# Patient Record
Sex: Male | Born: 1955
Health system: Southern US, Community
[De-identification: ages and names within clinical notes are randomized; demographics above are authoritative.]

## PROBLEM LIST (undated history)

## (undated) DIAGNOSIS — E785 Hyperlipidemia, unspecified: Secondary | ICD-10-CM

## (undated) DIAGNOSIS — J301 Allergic rhinitis due to pollen: Secondary | ICD-10-CM

## (undated) DIAGNOSIS — N042 Nephrotic syndrome with diffuse membranous glomerulonephritis, unspecified: Secondary | ICD-10-CM

## (undated) DIAGNOSIS — Z8619 Personal history of other infectious and parasitic diseases: Secondary | ICD-10-CM

## (undated) DIAGNOSIS — E119 Type 2 diabetes mellitus without complications: Secondary | ICD-10-CM

## (undated) DIAGNOSIS — T7840XA Allergy, unspecified, initial encounter: Secondary | ICD-10-CM

## (undated) DIAGNOSIS — K219 Gastro-esophageal reflux disease without esophagitis: Secondary | ICD-10-CM

## (undated) HISTORY — DX: Gastro-esophageal reflux disease without esophagitis: K21.9

## (undated) HISTORY — DX: Nephrotic syndrome with diffuse membranous glomerulonephritis, unspecified: N04.20

## (undated) HISTORY — DX: Type 2 diabetes mellitus without complications: E11.9

## (undated) HISTORY — PX: CIRCUMCISION: SUR203

## (undated) HISTORY — DX: Hyperlipidemia, unspecified: E78.5

## (undated) HISTORY — DX: Allergic rhinitis due to pollen: J30.1

## (undated) HISTORY — DX: Allergy, unspecified, initial encounter: T78.40XA

## (undated) HISTORY — DX: Personal history of other infectious and parasitic diseases: Z86.19

---

## 2000-11-19 ENCOUNTER — Emergency Department (HOSPITAL_COMMUNITY): Admission: EM | Admit: 2000-11-19 | Discharge: 2000-11-20 | Payer: Self-pay | Admitting: Emergency Medicine

## 2000-11-19 ENCOUNTER — Encounter: Payer: Self-pay | Admitting: Emergency Medicine

## 2001-11-03 ENCOUNTER — Encounter: Admission: RE | Admit: 2001-11-03 | Discharge: 2002-02-01 | Payer: Self-pay | Admitting: Internal Medicine

## 2003-10-18 ENCOUNTER — Emergency Department (HOSPITAL_COMMUNITY): Admission: EM | Admit: 2003-10-18 | Discharge: 2003-10-18 | Payer: Self-pay | Admitting: Emergency Medicine

## 2004-01-01 ENCOUNTER — Emergency Department (HOSPITAL_COMMUNITY): Admission: EM | Admit: 2004-01-01 | Discharge: 2004-01-01 | Payer: Self-pay | Admitting: Emergency Medicine

## 2004-01-02 ENCOUNTER — Emergency Department (HOSPITAL_COMMUNITY): Admission: EM | Admit: 2004-01-02 | Discharge: 2004-01-03 | Payer: Self-pay | Admitting: Emergency Medicine

## 2008-09-07 ENCOUNTER — Telehealth (INDEPENDENT_AMBULATORY_CARE_PROVIDER_SITE_OTHER): Payer: Self-pay | Admitting: *Deleted

## 2008-10-18 ENCOUNTER — Ambulatory Visit: Payer: Self-pay | Admitting: Internal Medicine

## 2008-10-18 LAB — CONVERTED CEMR LAB
ALT: 27 units/L (ref 0–53)
AST: 27 units/L (ref 0–37)
Albumin: 3.7 g/dL (ref 3.5–5.2)
Alkaline Phosphatase: 62 units/L (ref 39–117)
BUN: 12 mg/dL (ref 6–23)
Basophils Absolute: 0.2 10*3/uL — ABNORMAL HIGH (ref 0.0–0.1)
Basophils Relative: 2.4 % (ref 0.0–3.0)
Bilirubin Urine: NEGATIVE
Bilirubin, Direct: 0 mg/dL (ref 0.0–0.3)
CO2: 30 meq/L (ref 19–32)
Calcium: 8.9 mg/dL (ref 8.4–10.5)
Chloride: 106 meq/L (ref 96–112)
Cholesterol: 163 mg/dL (ref 0–200)
Creatinine, Ser: 0.8 mg/dL (ref 0.4–1.5)
Direct LDL: 93.6 mg/dL
Eosinophils Absolute: 0.4 10*3/uL (ref 0.0–0.7)
Eosinophils Relative: 4.9 % (ref 0.0–5.0)
GFR calc non Af Amer: 130.78 mL/min (ref >60)
Glucose, Bld: 135 mg/dL — ABNORMAL HIGH (ref 70–99)
HCT: 40.4 % (ref 39.0–52.0)
HDL: 47.6 mg/dL (ref >39.00)
Hemoglobin, Urine: NEGATIVE
Hemoglobin: 13.8 g/dL (ref 13.0–17.0)
Ketones, ur: NEGATIVE mg/dL
Leukocytes, UA: NEGATIVE
Lymphocytes Relative: 41 % (ref 12.0–46.0)
Lymphs Abs: 3.2 10*3/uL (ref 0.7–4.0)
MCHC: 34.3 g/dL (ref 30.0–36.0)
MCV: 96.2 fL (ref 78.0–100.0)
Monocytes Absolute: 0.5 10*3/uL (ref 0.1–1.0)
Monocytes Relative: 6 % (ref 3.0–12.0)
Neutro Abs: 3.5 10*3/uL (ref 1.4–7.7)
Neutrophils Relative %: 45.7 % (ref 43.0–77.0)
Nitrite: NEGATIVE
PSA: 0.46 ng/mL (ref 0.10–4.00)
Platelets: 344 10*3/uL (ref 150.0–400.0)
Potassium: 4.1 meq/L (ref 3.5–5.1)
RBC: 4.2 M/uL — ABNORMAL LOW (ref 4.22–5.81)
RDW: 12.5 % (ref 11.5–14.6)
Sodium: 141 meq/L (ref 135–145)
Specific Gravity, Urine: 1.02 (ref 1.000–1.030)
TSH: 0.9 microintl units/mL (ref 0.35–5.50)
Total Bilirubin: 0.7 mg/dL (ref 0.3–1.2)
Total CHOL/HDL Ratio: 3
Total Protein, Urine: NEGATIVE mg/dL
Total Protein: 6.7 g/dL (ref 6.0–8.3)
Triglycerides: 228 mg/dL — ABNORMAL HIGH (ref 0.0–149.0)
Urine Glucose: NEGATIVE mg/dL
Urobilinogen, UA: 0.2 (ref 0.0–1.0)
VLDL: 45.6 mg/dL — ABNORMAL HIGH (ref 0.0–40.0)
WBC: 7.8 10*3/uL (ref 4.5–10.5)
pH: 6 (ref 5.0–8.0)

## 2008-10-24 ENCOUNTER — Ambulatory Visit: Payer: Self-pay | Admitting: Internal Medicine

## 2008-10-24 LAB — CONVERTED CEMR LAB: Hgb A1c MFr Bld: 6.8 % — ABNORMAL HIGH (ref 4.6–6.5)

## 2008-10-25 DIAGNOSIS — A64 Unspecified sexually transmitted disease: Secondary | ICD-10-CM | POA: Insufficient documentation

## 2008-10-25 DIAGNOSIS — J309 Allergic rhinitis, unspecified: Secondary | ICD-10-CM | POA: Insufficient documentation

## 2009-08-14 ENCOUNTER — Encounter (INDEPENDENT_AMBULATORY_CARE_PROVIDER_SITE_OTHER): Payer: Self-pay | Admitting: *Deleted

## 2009-10-19 ENCOUNTER — Ambulatory Visit: Payer: Self-pay | Admitting: Internal Medicine

## 2009-10-19 DIAGNOSIS — B029 Zoster without complications: Secondary | ICD-10-CM | POA: Insufficient documentation

## 2010-01-22 ENCOUNTER — Ambulatory Visit: Payer: Self-pay | Admitting: Internal Medicine

## 2010-01-22 DIAGNOSIS — F17200 Nicotine dependence, unspecified, uncomplicated: Secondary | ICD-10-CM | POA: Insufficient documentation

## 2010-01-22 LAB — CONVERTED CEMR LAB
BUN: 8 mg/dL (ref 6–23)
Basophils Absolute: 0 10*3/uL (ref 0.0–0.1)
Basophils Relative: 0.5 % (ref 0.0–3.0)
CO2: 27 meq/L (ref 19–32)
Calcium: 9.1 mg/dL (ref 8.4–10.5)
Chloride: 108 meq/L (ref 96–112)
Cholesterol: 182 mg/dL (ref 0–200)
Creatinine, Ser: 0.9 mg/dL (ref 0.4–1.5)
Eosinophils Absolute: 0.3 10*3/uL (ref 0.0–0.7)
Eosinophils Relative: 3.8 % (ref 0.0–5.0)
GFR calc non Af Amer: 112.16 mL/min (ref >60)
Glucose, Bld: 101 mg/dL — ABNORMAL HIGH (ref 70–99)
HCT: 39.3 % (ref 39.0–52.0)
HDL: 48.1 mg/dL (ref >39.00)
Hemoglobin: 13.6 g/dL (ref 13.0–17.0)
LDL Cholesterol: 116 mg/dL — ABNORMAL HIGH (ref 0–99)
Lymphocytes Relative: 26.3 % (ref 12.0–46.0)
Lymphs Abs: 2.2 10*3/uL (ref 0.7–4.0)
MCHC: 34.6 g/dL (ref 30.0–36.0)
MCV: 97.4 fL (ref 78.0–100.0)
Monocytes Absolute: 0.4 10*3/uL (ref 0.1–1.0)
Monocytes Relative: 5.2 % (ref 3.0–12.0)
Neutro Abs: 5.4 10*3/uL (ref 1.4–7.7)
Neutrophils Relative %: 64.2 % (ref 43.0–77.0)
Platelets: 314 10*3/uL (ref 150.0–400.0)
Potassium: 4.4 meq/L (ref 3.5–5.1)
RBC: 4.03 M/uL — ABNORMAL LOW (ref 4.22–5.81)
RDW: 13.2 % (ref 11.5–14.6)
Sodium: 142 meq/L (ref 135–145)
Total CHOL/HDL Ratio: 4
Triglycerides: 91 mg/dL (ref 0.0–149.0)
VLDL: 18.2 mg/dL (ref 0.0–40.0)
WBC: 8.4 10*3/uL (ref 4.5–10.5)

## 2010-09-03 NOTE — Assessment & Plan Note (Signed)
Summary: cpx / bcbs / #/ cd   Vital Signs:  Patient profile:   55 year old male Height:      68 inches Weight:      170 pounds BMI:     25.94 O2 Sat:      97 % on Room air Temp:     98.0 degrees F oral Pulse rate:   56 / minute BP sitting:   120 / 72  (left arm) Cuff size:   regular  Vitals Entered By: Bill Salinas CMA (January 22, 2010 2:16 PM)  O2 Flow:  Room air CC: cpx/ ab   Primary Care Provider:  Jacques Navy MD  CC:  cpx/ ab.  History of Present Illness: Patient presents for general medical follow-up. He reports that he continues to have skin eruptions on the scalp. Hecontinues to have back spasm on the right buttock area with radiation to the leg. He plays tennis but doesn't do regular back exercises.  He reports an occasional muscle tightness across the anterior chest, non-exertional. Otherwise doing OK.  Current Medications (verified): 1)  None  Allergies (verified): No Known Drug Allergies  Past History:  Past Medical History: Last updated: 2008-11-09 UCD AODM (ICD-250.00) HAY FEVER (ICD-477.0) Hx of SEXUALLY TRANSMITTED DISEASE (ICD-099.9)  Past Surgical History: Last updated: 09-Nov-2008 Circumcision '91 (balanitis)  Family History: Last updated: 11/09/08 father - deceased @ 8: cancer mother - deceased @ 2: GSW Neg - colon or prostate cancer Pos - diabetes, arthritis  Social History: Last updated: 11/09/08 HSG Married '94 - divorced '97 no children work: Mining engineer parts - sales/delivery; Geophysicist/field seismologist; 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    Physical Exam  General:   WNWD AA male in no distress Head:  normocephalic and atraumatic.   Eyes:  No corneal or conjunctival inflammation noted. EOMI. Perrla. Funduscopic exam benign, without hemorrhages, exudates or papilledema. Vision grossly normal. Ears:  R ear normal and L ear normal.   Nose:  no external deformity and no external erythema.   Mouth:  Oral mucosa and oropharynx without lesions or exudates.  Teeth in good repair. Neck:  supple, full ROM, no thyromegaly, and no carotid bruits.   Chest Wall:  No deformities, masses, tenderness or gynecomastia noted. Lungs:  Normal respiratory effort, chest expands symmetrically. Lungs are clear to auscultation, no crackles or wheezes. Heart:  Normal rate and regular rhythm. S1 and S2 normal without gallop, murmur, click, rub or other extra sounds. Abdomen:  Bowel sounds positive,abdomen soft and non-tender without masses, organomegaly or hernias noted. Rectal:  No external abnormalities noted. Normal sphincter tone. No rectal masses or tenderness. Prostate:  Prostate gland firm and smooth, no enlargement, nodularity, tenderness, mass, asymmetry or induration. Msk:  normal ROM, no joint tenderness, no joint swelling, no joint warmth, and no joint deformities.   Pulses:  2+ radial and DP pulses Extremities:  No clubbing, cyanosis, edema, or deformity noted with normal full range of motion of all joints.   Neurologic:  alert & oriented X3, cranial nerves II-XII intact, strength normal in all extremities, sensation intact to light touch, gait normal, and DTRs symmetrical and normal.   Skin:  turgor normal, color normal, no rashes, and no ulcerations.  Cervical Nodes:  no anterior cervical adenopathy and no posterior cervical adenopathy.   Inguinal Nodes:  no R inguinal adenopathy and no L inguinal adenopathy.   Psych:  Oriented X3, memory intact for recent and remote, normally interactive, and good eye contact.     Impression & Recommendations:  Problem # 1:  TOBACCO  ABUSE (ICD-305.1) Smoke to at length about the dangers and hazards of smoking; the addictive nature of smoking.   Plan - encouraged to stop smoking            1-800-quit now           CXR  Orders: T-2 View CXR (71020TC) Tobacco use cessation intermediate 3-10 minutes (13086)  Addendum - negative study  Problem # 2:  AODM (ICD-250.00) No symptoms  Plan- routine lab  Addendum - glucose 101  Problem # 3:  Preventive Health Care (ICD-V70.0) Patient with normal history and physical exam. Labs are OK. Patient is active and health concious except for the aberration of smoking. He is encouraged to have colonoscopy - he will think this over. The importance of screening was stressed to him in terms of cancer prevention. He is given a tetnus booster today.  In summary - a nice man who is medically stable. He will return as needed.  Other Orders: TLB-Lipid Panel (80061-LIPID) TLB-BMP (Basic Metabolic Panel-BMET) (80048-METABOL) TLB-CBC Platelet - w/Differential (85025-CBCD) Tdap => 43yrs IM (57846) Admin 1st Vaccine (96295) tientIvar Drape Panuco Note: All result statuses are Final unless otherwise noted.  Tests: (1) Lipid Panel (LIPID)   Cholesterol               182 mg/dL                   2-841     ATP III Classification            Desirable:  < 200 mg/dL                    Borderline High:  200 - 239 mg/dL               High:  > = 240 mg/dL   Triglycerides             91.0 mg/dL                  3.2-440.1     Normal:  <150 mg/dL     Borderline High:  027 - 199 mg/dL   HDL                       25.36 mg/dL                 >64.40   VLDL Cholesterol          18.2 mg/dL                  3.4-74.2   LDL Cholesterol      [H]  595 mg/dL                   6-38  CHO/HDL Ratio:  CHD Risk                             4                    Men  Women     1/2 Average Risk     3.4          3.3     Average Risk          5.0          4.4     2X Average Risk          9.6          7.1      3X Average Risk          15.0          11.0                           Tests: (2) BMP (METABOL)   Sodium                    142 mEq/L                   135-145   Potassium                 4.4 mEq/L                   3.5-5.1   Chloride                  108 mEq/L                   96-112   Carbon Dioxide            27 mEq/L                    19-32   Glucose              [H]  101 mg/dL                   04-54   BUN                       8 mg/dL                     0-98   Creatinine                0.9 mg/dL                   1.1-9.1   Calcium                   9.1 mg/dL                   4.7-82.9   GFR                       112.16 mL/min               >60  Tests: (3) CBC Platelet w/Diff (CBCD)   White Cell Count          8.4 K/uL                    4.5-10.5   Red Cell Count       [L]  4.03 Mil/uL                 4.22-5.81   Hemoglobin                13.6 g/dL  13.0-17.0   Hematocrit                39.3 %                      39.0-52.0   MCV                       97.4 fl                     78.0-100.0   MCHC                      34.6 g/dL                   16.1-09.6   RDW                       13.2 %                      11.5-14.6   Platelet Count            314.0 K/uL                  150.0-400.0   Neutrophil %              64.2 %                      43.0-77.0   Lymphocyte %              26.3 %                      12.0-46.0   Monocyte %                5.2 %                       3.0-12.0   Eosinophils%              3.8 %                       0.0-5.0   Basophils %               0.5 %                       0.0-3.0   Neutrophill Absolute      5.4 K/uL                    1.4-7.7   Lymphocyte Absolute       2.2 K/uL                    0.7-4.0   Monocyte Absolute         0.4 K/uL                    0.1-1.0  Eosinophils, Absolute                             0.3 K/uL                    0.0-0.7   Basophils Absolute        0.0 K/uL  0.0-0.1  DG CHEST 2 VIEW  - [id number]   Clinical Data: Tobacco use   CHEST - 2 VIEW   Comparison: None.   Findings: Normal heart size.  Clear lungs.  No pneumothorax.  No pleural fluid.   IMPRESSION: No active cardiopulmonary disease.   Read By:  Jolaine Click,  M.D.  Immunization History:  Influenza Immunization History:    Influenza:  historical (01/22/2010)  Immunizations Administered:  Tetanus Vaccine:    Vaccine Type: Tdap    Site: left deltoid    Mfr: GlaxoSmithKline    Dose: 0.5 ml    Given by: Ami Bullins CMA    Exp. Date: 10/27/2011    Lot #: [id number]    VIS given: 06/22/07 version given January 22, 2010.

## 2010-09-03 NOTE — Miscellaneous (Signed)
Summary: [facility]   Imported By: Lester Wilsall 10/26/2009 10:19:09  _____________________________________________________________________  External Attachment:    Type:   Image     Comment:   External Document

## 2010-09-03 NOTE — Assessment & Plan Note (Signed)
Summary: TORSO RASH---STC   Vital Signs:  Patient profile:   55 year old male Height:      68 inches Weight:      170 pounds BMI:     25.94 O2 Sat:      98 % on Room air Temp:     97.2 degrees F oral Pulse rate:   56 / minute BP sitting:   110 / 78  (left arm) Cuff size:   large  Vitals Entered By: Bill Salinas CMA (October 19, 2009 10:05 AM)  O2 Flow:  Room air CC: pt here with complaint of itchy rash on his abd and back x 4 days/ ab  Vision Screening:      Vision Comments: last eye exam 1999   Primary Care Provider:  Jacques Navy MD  CC:  pt here with complaint of itchy rash on his abd and back x 4 days/ ab.  History of Present Illness: Having seasonal allergies with runny nose and itchy eyes.   Has a rash: scalp, chest and back. The rash on the back and chest is just on the left side and it is a little uncomfortable.   Current Medications (verified): 1)  None  Allergies (verified): No Known Drug Allergies  Past History:  Past Medical History: Last updated: 11/16/08 UCD AODM (ICD-250.00) HAY FEVER (ICD-477.0) Hx of SEXUALLY TRANSMITTED DISEASE (ICD-099.9)  Past Surgical History: Last updated: November 16, 2008 Circumcision '91 (balanitis)  Family History: Last updated: 11/16/08 father - deceased @ 37: cancer mother - deceased @ 77: GSW Neg - colon or prostate cancer Pos - diabetes, arthritis  Social History: Last updated: 2008/11/16 HSG Married '94 - divorced '97 no children work: Mining engineer parts - sales/delivery; Geophysicist/field seismologist; 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    Physical Exam  General:  WNWD AA male in no distress Skin:  erythematous macular raised rash at the mid-line of the back at t 11 and on  the anterior chest under the left  breast at t8-9 and towards the left of the umbilicus.  Scapl without distinct active rash.    Impression & Recommendations:  Problem # 1:  SHINGLES (ICD-053.9) Rash that is suspicious for early shingels.  Plan - V altrex 100mg  three times a day x 7            Prednisone 20mg  once daily x 7  Complete Medication List: 1)  Valtrex 1 Gm Tabs (Valacyclovir hcl) .Marland Kitchen.. 1 by mouth three times a day x 7 for shingles 2)  Prednisone 20 Mg Tabs (Prednisone) .Marland Kitchen.. 1 by mouth once daily x 7  Patient Instructions: 1)  Rash on back and left chest suspicious for early shingles. Plan - valtrex 1g taken three times a day for 7 days along with prednisone 20mg  taken daily for 7 days. 2)  seasonal allergies - try taking loratadine 10mg  once a day - over the counter 3)  scalp - need to return when the rash is present to better diagnose and treat. Prescriptions: PREDNISONE 20 MG TABS (PREDNISONE) 1 by mouth once daily x 7  #7 x 0   Entered and Authorized by:   Jacques Navy MD   Signed by:   Jacques Navy MD on 10/19/2009   Method used:   Electronically to  [facility]  [address] [phone]* (retail)       [address]       [place], [place]  [address]       Ph: [phone] or [phone]       Fax: [phone]   RxID:   [id number] VALTREX 1 GM TABS (VALACYCLOVIR HCL) 1 by mouth three times a day x 7 for shingles  #21 x 0   Entered and Authorized by:   Jacques Navy MD   Signed by:   Jacques Navy MD on 10/19/2009   Method used:   [facility]        [facility]  [address] [phone]* (retail)       [address]       [place], [place]  [id number]       Ph: [phone] or [phone]       Fax: [phone]   RxID:   [id number]    Immunization History:  Tetanus/Td Immunization History:    Tetanus/Td:  historical (11/03/1999)  Not Administered:    Influenza Vaccine not given  due to: declined

## 2010-09-03 NOTE — Letter (Signed)
Summary: Referral - not able to see patient  [facility]  [address] [place], [place] [address]   Phone: [phone]  Fax: [phone]         August 14, 2009   Re:   Daniel Jones DOB:  [birth year] MRN:   [id number]    Dear Dr. Illene Regulus:  Thank you for your kind referral of the above patient.  We have attempted to schedule the recommended procedure for a Colonoscopy but have not been able to schedule because:   X  The patient was not available by phone and/or has not returned our calls.  ___ The patient declined to schedule the procedure at this time.  We appreciate the referral and hope that we will have the opportunity to treat this patient in the future.    Sincerely,  [facility] [phone]

## 2011-03-13 ENCOUNTER — Other Ambulatory Visit (INDEPENDENT_AMBULATORY_CARE_PROVIDER_SITE_OTHER): Payer: Self-pay

## 2011-03-13 ENCOUNTER — Other Ambulatory Visit: Payer: Self-pay | Admitting: Internal Medicine

## 2011-03-13 DIAGNOSIS — Z0389 Encounter for observation for other suspected diseases and conditions ruled out: Secondary | ICD-10-CM

## 2011-03-13 DIAGNOSIS — Z0000 Encounter for general adult medical examination without abnormal findings: Secondary | ICD-10-CM

## 2011-03-13 LAB — BASIC METABOLIC PANEL
CO2: 26 mEq/L (ref 19–32)
Chloride: 105 mEq/L (ref 96–112)
Creatinine, Ser: 0.9 mg/dL (ref 0.4–1.5)
Potassium: 3.8 mEq/L (ref 3.5–5.1)

## 2011-03-13 LAB — CBC WITH DIFFERENTIAL/PLATELET
Basophils Relative: 0.8 % (ref 0.0–3.0)
Eosinophils Relative: 5.2 % — ABNORMAL HIGH (ref 0.0–5.0)
Lymphocytes Relative: 37.2 % (ref 12.0–46.0)
Neutrophils Relative %: 50.3 % (ref 43.0–77.0)
Platelets: 306 10*3/uL (ref 150.0–400.0)
RBC: 3.86 Mil/uL — ABNORMAL LOW (ref 4.22–5.81)
WBC: 7.2 10*3/uL (ref 4.5–10.5)

## 2011-03-13 LAB — LIPID PANEL
HDL: 53.7 mg/dL (ref >39.00)
LDL Cholesterol: 90 mg/dL (ref 0–99)
Total CHOL/HDL Ratio: 3
Triglycerides: 78 mg/dL (ref 0.0–149.0)
VLDL: 15.6 mg/dL (ref 0.0–40.0)

## 2011-03-13 LAB — TSH: TSH: 0.75 u[IU]/mL (ref 0.35–5.50)

## 2011-03-13 LAB — URINALYSIS
Bilirubin Urine: NEGATIVE
Hgb urine dipstick: NEGATIVE
Ketones, ur: NEGATIVE
Leukocytes, UA: NEGATIVE
Specific Gravity, Urine: <=1.005 (ref 1.000–1.030)
Urine Glucose: NEGATIVE
Urobilinogen, UA: 0.2 (ref 0.0–1.0)

## 2011-03-13 LAB — HEPATIC FUNCTION PANEL
ALT: 18 U/L (ref 0–53)
Albumin: 4 g/dL (ref 3.5–5.2)
Total Protein: 6.8 g/dL (ref 6.0–8.3)

## 2011-03-13 LAB — PSA: PSA: 0.7 ng/mL (ref 0.10–4.00)

## 2011-03-19 ENCOUNTER — Encounter: Payer: Self-pay | Admitting: Internal Medicine

## 2011-03-20 ENCOUNTER — Ambulatory Visit (INDEPENDENT_AMBULATORY_CARE_PROVIDER_SITE_OTHER): Payer: BC Managed Care – PPO | Admitting: Internal Medicine

## 2011-03-20 ENCOUNTER — Encounter: Payer: Self-pay | Admitting: Internal Medicine

## 2011-03-20 VITALS — BP 116/62 | HR 57 | Temp 98.1°F | Ht 67.5 in | Wt 157.0 lb

## 2011-03-20 DIAGNOSIS — Z1211 Encounter for screening for malignant neoplasm of colon: Secondary | ICD-10-CM

## 2011-03-20 DIAGNOSIS — E119 Type 2 diabetes mellitus without complications: Secondary | ICD-10-CM

## 2011-03-20 DIAGNOSIS — Z0000 Encounter for general adult medical examination without abnormal findings: Secondary | ICD-10-CM

## 2011-03-20 DIAGNOSIS — Z136 Encounter for screening for cardiovascular disorders: Secondary | ICD-10-CM

## 2011-03-20 NOTE — Progress Notes (Signed)
Subjective:    Patient ID: Daniel Jones, male    DOB: Sep 16, 1955, 55 y.o.   MRN: [id number]  HPI Daniel Jones presents for a routine general medical exam. He reports that he has been doing well with no intervening serious illness, no injury and no surgery.   Past Medical History  Diagnosis Date  . Allergic rhinitis due to pollen   . History of sexually transmitted disease    Past Surgical History  Procedure Date  . Circumcision     1991   Family History  Problem Relation Age of Onset  . Cancer Father   . Diabetes Sister   . Gout Sister   . Heart disease Sister   . Diabetes Sister   . Hypertension Sister   . Hyperlipidemia Sister    History   Social History  . Marital Status: Married    Spouse Name: N/A    Number of Children: N/A  . Years of Education: N/A   Occupational History  . Not on file.   Social History Main Topics  . Smoking status: Former Smoker    Types: Cigarettes    Quit date: 03/12/2011  . Smokeless tobacco: Never Used  . Alcohol Use: No  . Drug Use: No  . Sexually Active: Yes -- Male partner(s)   Other Topics Concern  . Not on file   Social History Narrative   HSG. [employer] - 2 years - diploma. Married '94- divorced '97. No children.  Lives alone. 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        Review of Systems Review of Systems  Constitutional:  Negative for fever, chills, activity change and unexpected weight change.  HEENT:  Negative for hearing loss, ear pain, congestion, neck stiffness and postnasal drip. Negative for sore throat or swallowing problems. positive for dental complaints-jhaving extractions.   Eyes: Negative for vision loss or change in visual acuity.  Respiratory: Negative for chest tightness and wheezing.   Cardiovascular: Negative for chest pain and palpitation. No decreased exercise tolerance Gastrointestinal: No change in bowel habit. No  bloating or gas. No reflux or indigestion Genitourinary: Negative for urgency, frequency, flank pain and difficulty urinating.  Musculoskeletal: Negative for myalgias, back pain, arthralgias and gait problem.  Neurological: Negative for dizziness, tremors, weakness and headaches.  Hematological: Negative for adenopathy.  Psychiatric/Behavioral: Negative for behavioral problems and dysphoric mood.       Objective:   Physical Exam Vital signs reviewed Gen'l: Well nourished well developed AA male in no acute distress  HENT:  Head: Normocephalic and atraumatic.  Right Ear: External ear normal. EAC/TM nl Left Ear: External ear normal.  EAC/TM nl Nose: Nose normal.  Mouth/Throat: Oropharynx is clear and moist. Dentition - native, in good repair. No buccal or palatal lesions. Posterior pharynx clear. Eyes: Conjunctivae and sclera clear. EOM intact. Pupils are equal, round, and reactive to light. Right eye exhibits no discharge. Left eye exhibits no discharge. Neck: Normal range of motion. Neck supple. No JVD present. No tracheal deviation present. No thyromegaly present.  Cardiovascular: Normal rate, regular rhythm, no gallop, no friction rub, no murmur heard.      Quiet precordium. 2+ radial and DP pulses . No carotid bruits Pulmonary/Chest: Effort normal. No respiratory distress or increased WOB, no wheezes, no rales. No chest wall deformity or CVAT. Abdominal: Soft. Bowel sounds are normal in all quadrants. He exhibits no distension, no tenderness, no rebound or guarding,  No heptosplenomegaly  Genitourinary:  deferred to normal PSA Musculoskeletal: Normal range of motion. He exhibits no edema and no tenderness.       Small and large joints without redness, synovial thickening or deformity. Full range of motion preserved about all small, median and large joints.  Lymphadenopathy:    He has no cervical or supraclavicular adenopathy.  Neurological: He is alert and oriented to person, place,  and time. CN II-XII intact. DTRs 2+ and symmetrical biceps, radial and patellar tendons. Cerebellar function normal with no tremor, rigidity, normal gait and station.  Skin: Skin is warm and dry. No rash noted. No erythema.  Psychiatric: He has a normal mood and affect. His behavior is normal. Thought content normal.   Lab Results  Component Value Date   WBC 7.2 03/13/2011   HGB 12.6* 03/13/2011   HCT 37.9* 03/13/2011   PLT 306.0 03/13/2011   CHOL 159 03/13/2011   TRIG 78.0 03/13/2011   HDL 53.70 03/13/2011   LDLDIRECT 93.6 10/18/2008   ALT 18 03/13/2011   AST 24 03/13/2011   NA 138 03/13/2011   K 3.8 03/13/2011   CL 105 03/13/2011   CREATININE 0.9 03/13/2011   BUN 10 03/13/2011   CO2 26 03/13/2011   TSH 0.75 03/13/2011   PSA 0.70 03/13/2011   HGBA1C 6.8* 10/24/2008        Glucose                113                                                                  03/13/2011         Assessment & Plan:

## 2011-03-23 DIAGNOSIS — Z0000 Encounter for general adult medical examination without abnormal findings: Secondary | ICD-10-CM | POA: Insufficient documentation

## 2011-03-23 NOTE — Assessment & Plan Note (Signed)
Patient had A1C 6.8%in 2010. His serum glucose in 2011 = 111, this visit 113. He is conscientious about his diet.  Plan - continue life-style management of this problem

## 2011-03-23 NOTE — Assessment & Plan Note (Signed)
Interval medical history is benign. Physical exam is normal. Lab results are in normal limits. He is a candidate for colorectal cancer screening and will be referred to GI for colonoscopy. Immunizations: Tetanus June '11. 12 lead EKG without signs of ischemia or injury.  In summary - a very nice, hard-working man who is medically stable and doing well. He is encouraged to continue life-style management of his diabetes and to have colon cancer screening. He will return as needed or in one year.

## 2011-05-29 ENCOUNTER — Encounter: Payer: Self-pay | Admitting: Gastroenterology

## 2013-05-19 ENCOUNTER — Encounter: Payer: Self-pay | Admitting: Internal Medicine

## 2013-05-19 ENCOUNTER — Ambulatory Visit (INDEPENDENT_AMBULATORY_CARE_PROVIDER_SITE_OTHER): Payer: BC Managed Care – PPO | Admitting: Internal Medicine

## 2013-05-19 VITALS — BP 146/94 | HR 56 | Temp 98.4°F | Wt 175.0 lb

## 2013-05-19 DIAGNOSIS — J309 Allergic rhinitis, unspecified: Secondary | ICD-10-CM

## 2013-05-19 NOTE — Progress Notes (Signed)
  Subjective:    Patient ID: Daniel Jones, male    DOB: 01-20-1956, 57 y.o.   MRN: [id number]  HPI Mr. Romig presents for evaluation of allergy type symptoms with rhinorrhea and itchy  Eyes. He has tried over the counter allergy doesn't work long; long acting antihistamines don't give long term relief or work through out the day. Drainage is clear. He will get sinus pressure and nasal blockage. Does not recall being allergy tested for last several years.  He did have hay fever as a child. No fever or chills, no SOB, no wheezing.  He will get diffuse itching and does have intermittent rash groin and axilla.   Past Medical History  Diagnosis Date  . Allergic rhinitis due to pollen   . History of sexually transmitted disease    Past Surgical History  Procedure Laterality Date  . Circumcision      1991   Family History  Problem Relation Age of Onset  . Cancer Father   . Diabetes Sister   . Gout Sister   . Heart disease Sister   . Diabetes Sister   . Hypertension Sister   . Hyperlipidemia Sister    History   Social History  . Marital Status: Married    Spouse Name: N/A    Number of Children: N/A  . Years of Education: N/A   Occupational History  . Not on file.   Social History Main Topics  . Smoking status: Former Smoker    Types: Cigarettes    Quit date: 03/12/2011  . Smokeless tobacco: Never Used  . Alcohol Use: No  . Drug Use: No  . Sexual Activity: Yes    Partners: Female   Other Topics Concern  . Not on file   Social History Narrative   HSG. [employer] - 2 years - diploma. Married '94- divorced '97. No children.  Lives alone. 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      No current outpatient prescriptions on file prior to visit.   No current facility-administered medications on file prior to visit.      Review of Systems System review is negative for any constitutional, cardiac,  pulmonary, GI or neuro symptoms or complaints other than as described in the HPI.     Objective:   Physical Exam Filed Vitals:   05/19/13 1502  BP: 146/94  Pulse: 56  Temp: 98.4 F (36.9 C)   BP Readings from Last 3 Encounters:  05/19/13 146/94  03/20/11 116/62  01/22/10 120/72   Gen'l - WNWD man looking younger than his years HEENT- TMs normal, throat clear, C&S clear Nodes - negative cervical Cor- RRR, 2+ pulse Pulm - CTAP Derm- no visible rash neck or arms. Groin not inspected.         Assessment & Plan:

## 2013-05-19 NOTE — Patient Instructions (Signed)
Allergic Rhinitis - your symptoms are consistent with year round allergy. There is no evidence by history or exam of infection.  Plan Nasonex - a nasal steroid spray to down regulate the immune system and control allergy symptoms. Sprays to each nostril twice a day.  Continue taking Allegra or Claritin  For days when you get sinus pressure you may take sudafed 30 mg twice a day  If your symptoms don't improve on this regimen I recommend an allergy evaluation.   ** if the nasonex helps let me know and a Rx for a generic product can be sent in for you.    Allergic Rhinitis Allergic rhinitis is when the mucous membranes in the nose respond to allergens. Allergens are particles in the air that cause your body to have an allergic reaction. This causes you to release allergic antibodies. Through a chain of events, these eventually cause you to release histamine into the blood stream (hence the use of antihistamines). Although meant to be protective to the body, it is this release that causes your discomfort, such as frequent sneezing, congestion and an itchy runny nose.  CAUSES  The pollen allergens may come from grasses, trees, and weeds. This is seasonal allergic rhinitis, or "hay fever." Other allergens cause year-round allergic rhinitis (perennial allergic rhinitis) such as house dust mite allergen, pet dander and mold spores.  SYMPTOMS   Nasal stuffiness (congestion).  Runny, itchy nose with sneezing and tearing of the eyes.  There is often an itching of the mouth, eyes and ears. It cannot be cured, but it can be controlled with medications. DIAGNOSIS  If you are unable to determine the offending allergen, skin or blood testing may find it. TREATMENT   Avoid the allergen.  Medications and allergy shots (immunotherapy) can help.  Hay fever may often be treated with antihistamines in pill or nasal spray forms. Antihistamines block the effects of histamine. There are over-the-counter  medicines that may help with nasal congestion and swelling around the eyes. Check with your caregiver before taking or giving this medicine. If the treatment above does not work, there are many new medications your caregiver can prescribe. Stronger medications may be used if initial measures are ineffective. Desensitizing injections can be used if medications and avoidance fails. Desensitization is when a patient is given ongoing shots until the body becomes less sensitive to the allergen. Make sure you follow up with your caregiver if problems continue. SEEK MEDICAL CARE IF:   You develop fever (more than 100.5 F (38.1 C).  You develop a cough that does not stop easily (persistent).  You have shortness of breath.  You start wheezing.  Symptoms interfere with normal daily activities. Document Released: 04/15/2001 Document Revised: 10/13/2011 Document Reviewed: 10/25/2008 [facility] Patient Information 2014 [place], [place].

## 2013-05-22 NOTE — Assessment & Plan Note (Signed)
Allergic Rhinitis - your symptoms are consistent with year round allergy. There is no evidence by history or exam of infection.  Plan Nasonex - a nasal steroid spray to down regulate the immune system and control allergy symptoms. Sprays to each nostril twice a day.  Continue taking Allegra or Claritin  For days when you get sinus pressure you may take sudafed 30 mg twice a day  If your symptoms don't improve on this regimen I recommend an allergy evaluation.   ** if the nasonex helps let me know and a Rx for a generic product can be sent in for you.

## 2013-08-05 ENCOUNTER — Other Ambulatory Visit: Payer: BC Managed Care – PPO

## 2013-08-11 ENCOUNTER — Encounter: Payer: Self-pay | Admitting: Internal Medicine

## 2013-08-11 ENCOUNTER — Other Ambulatory Visit (INDEPENDENT_AMBULATORY_CARE_PROVIDER_SITE_OTHER): Payer: BC Managed Care – PPO

## 2013-08-11 ENCOUNTER — Ambulatory Visit (INDEPENDENT_AMBULATORY_CARE_PROVIDER_SITE_OTHER): Payer: BC Managed Care – PPO | Admitting: Internal Medicine

## 2013-08-11 VITALS — BP 140/90 | HR 57 | Temp 98.2°F | Ht 67.0 in | Wt 174.8 lb

## 2013-08-11 DIAGNOSIS — Z0000 Encounter for general adult medical examination without abnormal findings: Secondary | ICD-10-CM

## 2013-08-11 DIAGNOSIS — Z1211 Encounter for screening for malignant neoplasm of colon: Secondary | ICD-10-CM

## 2013-08-11 DIAGNOSIS — R252 Cramp and spasm: Secondary | ICD-10-CM

## 2013-08-11 DIAGNOSIS — F17200 Nicotine dependence, unspecified, uncomplicated: Secondary | ICD-10-CM

## 2013-08-11 DIAGNOSIS — R972 Elevated prostate specific antigen [PSA]: Secondary | ICD-10-CM

## 2013-08-11 DIAGNOSIS — Z125 Encounter for screening for malignant neoplasm of prostate: Secondary | ICD-10-CM

## 2013-08-11 DIAGNOSIS — E785 Hyperlipidemia, unspecified: Secondary | ICD-10-CM

## 2013-08-11 DIAGNOSIS — E119 Type 2 diabetes mellitus without complications: Secondary | ICD-10-CM

## 2013-08-11 LAB — LIPID PANEL
Cholesterol: 239 mg/dL — ABNORMAL HIGH (ref 0–200)
HDL: 55.8 mg/dL (ref >39.00)
Total CHOL/HDL Ratio: 4
Triglycerides: 135 mg/dL (ref 0.0–149.0)
VLDL: 27 mg/dL (ref 0.0–40.0)

## 2013-08-11 LAB — COMPREHENSIVE METABOLIC PANEL
ALT: 23 U/L (ref 0–53)
AST: 24 U/L (ref 0–37)
Albumin: 4.1 g/dL (ref 3.5–5.2)
Alkaline Phosphatase: 67 U/L (ref 39–117)
BUN: 9 mg/dL (ref 6–23)
CALCIUM: 9.4 mg/dL (ref 8.4–10.5)
CHLORIDE: 105 meq/L (ref 96–112)
CO2: 28 mEq/L (ref 19–32)
Creatinine, Ser: 0.9 mg/dL (ref 0.4–1.5)
GFR: 110.69 mL/min (ref >60.00)
Glucose, Bld: 117 mg/dL — ABNORMAL HIGH (ref 70–99)
Potassium: 4.2 mEq/L (ref 3.5–5.1)
SODIUM: 140 meq/L (ref 135–145)
TOTAL PROTEIN: 7.5 g/dL (ref 6.0–8.3)
Total Bilirubin: 0.6 mg/dL (ref 0.3–1.2)

## 2013-08-11 LAB — MAGNESIUM: MAGNESIUM: 1.9 mg/dL (ref 1.5–2.5)

## 2013-08-11 LAB — PSA: PSA: 2.26 ng/mL (ref 0.10–4.00)

## 2013-08-11 LAB — LDL CHOLESTEROL, DIRECT: Direct LDL: 141.5 mg/dL

## 2013-08-11 LAB — HEMOGLOBIN A1C: Hgb A1c MFr Bld: 8 % — ABNORMAL HIGH (ref 4.6–6.5)

## 2013-08-11 NOTE — Progress Notes (Signed)
Subjective:    Patient ID: Daniel Jones, male    DOB: 05-01-1956, 58 y.o.   MRN: [id number]  HPI Daniel Jones presents for an annual exam. In the interval since his last visit he has been healthy: no major illness, surgery or injury. [facility] CBG 204.This was about an hour after a meal.   Past Medical History  Diagnosis Date  . Allergic rhinitis due to pollen   . History of sexually transmitted disease    Past Surgical History  Procedure Laterality Date  . Circumcision      1991   Family History  Problem Relation Age of Onset  . Cancer Father   . Diabetes Sister   . Gout Sister   . Heart disease Sister   . Diabetes Sister   . Hypertension Sister   . Hyperlipidemia Sister    History   Social History  . Marital Status: Married    Spouse Name: N/A    Number of Children: N/A  . Years of Education: N/A   Occupational History  . Not on file.   Social History Main Topics  . Smoking status: Former Smoker    Types: Cigarettes    Quit date: 03/12/2011  . Smokeless tobacco: Never Used  . Alcohol Use: No  . Drug Use: No  . Sexual Activity: Yes    Partners: Female   Other Topics Concern  . Not on file   Social History Narrative   HSG. [employer] - 2 years - diploma. Married '94- divorced '97. No children.  Lives alone. 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     No current outpatient prescriptions on file prior to visit.   No current facility-administered medications on file prior to visit.     Review of Systems Constitutional:  Negative for fever, chills, activity change and unexpected weight change.  HEENT:  Negative for hearing loss, ear pain, congestion, neck stiffness and postnasal drip. Negative for sore throat or swallowing problems. Negative for dental complaints.   Eyes: Negative for vision loss or change in visual acuity.  Respiratory: Negative for chest tightness  and wheezing. Negative for DOE.   Cardiovascular: Negative for chest pain or palpitations. No decreased exercise tolerance Gastrointestinal: No change in bowel habit. No bloating or gas. No reflux or indigestion Genitourinary: Negative for urgency, frequency, flank pain and difficulty urinating.  Musculoskeletal: Negative for myalgias, back pain, arthralgias and gait problem.  Neurological: Negative for dizziness, tremors, weakness and headaches.  Hematological: Negative for adenopathy.  Psychiatric/Behavioral: Negative for behavioral problems and dysphoric mood.       Objective:   Physical Exam Filed Vitals:   08/11/13 1330  BP: 140/90  Pulse: 57  Temp: 98.2 F (36.8 C)   Wt Readings from Last 3 Encounters:  08/11/13 174 lb 12.8 oz (79.289 kg)  05/19/13 175 lb (79.379 kg)  03/20/11 157 lb (71.215 kg)   Gen'l: Well nourished well developed     male in no acute distress  HEENT: Head: Normocephalic and atraumatic. Right Ear: External ear normal. EAC/TM nl. Left Ear: External ear normal.  EAC/TM nl. Nose: Nose normal. Mouth/Throat: Oropharynx is clear and moist. Dentition - native, in good repair. No buccal or palatal lesions. Posterior pharynx clear. Eyes: Conjunctivae and sclera clear. EOM intact. Pupils are equal, round, and reactive to light. Right eye exhibits no discharge. Left eye exhibits no discharge. Neck: Normal range of  motion. Neck supple. No JVD present. No tracheal deviation present. No thyromegaly present.  Cardiovascular: Normal rate, regular rhythm, no gallop, no friction rub, no murmur heard.      Quiet precordium. 2+ radial and DP pulses . No carotid bruits Pulmonary/Chest: Effort normal. No respiratory distress or increased WOB, no wheezes, no rales. No chest wall deformity or CVAT. Abdomen: Soft. Bowel sounds are normal in all quadrants. He exhibits no distension, no tenderness, no rebound or guarding, No heptosplenomegaly  Genitourinary:   Musculoskeletal: Normal  range of motion. He exhibits no edema and no tenderness.       Small and large joints without redness, synovial thickening or deformity. Full range of motion preserved about all small, median and large joints.  Lymphadenopathy:    He has no cervical, inguinal or supraclavicular adenopathy.  Neurological: He is alert and oriented to person, place, and time. CN II-XII intact. DTRs 2+ and symmetrical biceps, radial and patellar tendons. Cerebellar function normal with no tremor, rigidity, normal gait and station.  Skin: Skin is warm and dry. No rash noted. No erythema.  Psychiatric: He has a normal mood and affect. His behavior is normal. Thought content normal.            Assessment & Plan:  g

## 2013-08-11 NOTE — Patient Instructions (Signed)
Thanks for coming in to see us.  Your physical exam is normal - no enlarged lymph nodes on exam  Chart review - in 2010 your A1C was 6.8% - elevated above normal which is up to 6.5% - by definition this is diabetes. In subsequent years your serum blood sugars were normal. This means that you have mild diabetes that has been well controlled by following a NO SUGAR and low carbohydrate diet and exercising on a regular basis. If you have low blood sugar in the morning this means that you did not have a bedtime snack - which you should have: crackers and cheese, piece of fruit.  Lab is ordered today: cholesterol , A1C, basic chemistry panel and the results will be mailed to you - takes about a week.  Immunization - last tetanus shot in 2011.   Health maintenance - you will get a call about being scheduled for colonsocopy.   Overall you appear to be healthy and medically stable.

## 2013-08-11 NOTE — Progress Notes (Signed)
Pre visit review using our clinic review tool, if applicable. No additional management support is needed unless otherwise documented below in the visit note. 

## 2013-08-12 DIAGNOSIS — E785 Hyperlipidemia, unspecified: Secondary | ICD-10-CM | POA: Insufficient documentation

## 2013-08-12 NOTE — Assessment & Plan Note (Signed)
Interval history - no major illness, surgery or injury. Physical exam is normal. Lab - chemistry normal, A1C markedly elevated, lipid panel elevated. Colorectal cancer screening - to be scheduled. Prostate cancer screening - PSA up from 0.9 to 2.26, acceleration of 0.6 per year.  In summary - a nice man who will need return office visit to discuss diabetes, hyperlipidemia and rising PSA.

## 2013-08-12 NOTE — Assessment & Plan Note (Signed)
Elevated TC and LDL, way above goal for diabetic.  Plan ROV to discuss treatment options

## 2013-08-12 NOTE — Assessment & Plan Note (Signed)
Quit August '08. No respiratory complaints.

## 2013-08-12 NOTE — Assessment & Plan Note (Signed)
Patient with previously diet controlled diabetes. [facility] Lab today - normal serum glucose but A1C HIGH @ 8%  Plan OV to discuss DM management

## 2013-08-14 ENCOUNTER — Encounter: Payer: Self-pay | Admitting: Internal Medicine

## 2013-08-15 ENCOUNTER — Telehealth: Payer: Self-pay

## 2013-08-15 NOTE — Telephone Encounter (Signed)
Called pt, lvmom.  

## 2013-08-15 NOTE — Telephone Encounter (Signed)
Message copied by Newell CoralWILLIAMS, Braiden Presutti J on Mon Aug 15, 2013  2:26 PM ------      Message from: Jacques NavyNORINS, MICHAEL E      Created: Fri Aug 12, 2013  5:50 PM       OV to discuss lab results from Jan 8th physical.      Thanks ------

## 2013-08-18 ENCOUNTER — Encounter: Payer: Self-pay | Admitting: Internal Medicine

## 2013-08-18 ENCOUNTER — Ambulatory Visit (INDEPENDENT_AMBULATORY_CARE_PROVIDER_SITE_OTHER): Payer: BC Managed Care – PPO | Admitting: Internal Medicine

## 2013-08-18 VITALS — BP 130/90 | HR 55 | Temp 99.3°F | Wt 174.4 lb

## 2013-08-18 DIAGNOSIS — E785 Hyperlipidemia, unspecified: Secondary | ICD-10-CM

## 2013-08-18 DIAGNOSIS — E119 Type 2 diabetes mellitus without complications: Secondary | ICD-10-CM

## 2013-08-18 NOTE — Progress Notes (Signed)
Pre visit review using our clinic review tool, if applicable. No additional management support is needed unless otherwise documented below in the visit note. 

## 2013-08-18 NOTE — Progress Notes (Signed)
   Subjective:    Patient ID: Alfonso RamusWillie J Francisco, male    DOB: Oct 31, 1955, 58 y.o.   MRN: [id number]  HPI Mr. Ladona RidgelRatliff presents for a "whipping" due to his A1C being 8%. Also LDL cholesterol has gone from 90, at goal, to 147.5 way above goal. Mr. Ladona RidgelRatliff admits to dietary indiscretion - eats ice-cream every night and is not as tightly controlling carbs as he could. Discussed the long term advers effects of uncontrolled diabetes - heart disease, strokes, kidney failure, blindness and loss of limb.   PMH, FamHx and SocHx reviewed for any changes and relevance.  No current outpatient prescriptions on file prior to visit.   No current facility-administered medications on file prior to visit.      Review of Systems System review is negative for any constitutional, cardiac, pulmonary, GI or neuro symptoms or complaints other than as described in the HPI.     Objective:   Physical Exam Filed Vitals:   08/18/13 1534  BP: 130/90  Pulse: 55  Temp: 99.3 F (37.4 C)   Gen'l - WNWD man in no distress Cor - 2+ radial pulse, RRR Pulm - normal  Neuro - alert and oriented.        Assessment & Plan:  (greater than 50% of  25 min visit spent on education and counseling)

## 2013-08-18 NOTE — Patient Instructions (Signed)
Daniel Jones presents for a "whipping" due to his A1C being 8%. Also LDL cholesterol has gone from 90, at goal, to 147.5 way above goal. Daniel Jones admits to dietary indiscretion - eats ice-cream every night and is not as tightly controlling carbs as he could. Discussed the long term advers effects of uncontrolled diabetes - heart disease, strokes, kidney failure, blindness and loss of limb. Also the compounded risk of high cholesterol.  Plan For the next three months a strict diet: NO SUGAR , very low carb and very low fat diet.   Repeat lab in 3 months - if the numbers are not at goal will initiate medical therapy for diabetes and high cholesterol.  

## 2013-08-19 ENCOUNTER — Telehealth: Payer: Self-pay

## 2013-08-19 NOTE — Telephone Encounter (Signed)
Relevant patient education mailed to patient.  

## 2013-08-20 NOTE — Assessment & Plan Note (Signed)
Stressed the need for good control of lipids in the diabetic patient and the value of "Statin" drugs.  Plan 3 month trial of life-style management (no ice cream) with repeat lab. Recommendations to follow.

## 2013-08-20 NOTE — Assessment & Plan Note (Signed)
Mr. Daniel Jones presents for a "whipping" due to his A1C being 8%. Also LDL cholesterol has gone from 90, at goal, to 147.5 way above goal. Mr. Daniel Jones admits to dietary indiscretion - eats ice-cream every night and is not as tightly controlling carbs as he could. Discussed the long term advers effects of uncontrolled diabetes - heart disease, strokes, kidney failure, blindness and loss of limb. Also the compounded risk of high cholesterol.  Plan For the next three months a strict diet: NO SUGAR , very low carb and very low fat diet.   Repeat lab in 3 months - if the numbers are not at goal will initiate medical therapy for diabetes and high cholesterol.

## 2013-11-09 ENCOUNTER — Encounter: Payer: Self-pay | Admitting: Internal Medicine

## 2014-02-02 ENCOUNTER — Telehealth: Payer: Self-pay | Admitting: *Deleted

## 2014-02-02 NOTE — Telephone Encounter (Signed)
Left message on machine for patient to call and schedule a diabetic follow up appointment.

## 2014-03-14 ENCOUNTER — Telehealth: Payer: Self-pay

## 2014-03-14 NOTE — Telephone Encounter (Signed)
LVM for pt to call back and confirm new PCP since Norins had retired.  Diabetic Bundle Pt.

## 2014-08-09 ENCOUNTER — Ambulatory Visit: Payer: BC Managed Care – PPO | Admitting: Family

## 2014-08-17 ENCOUNTER — Ambulatory Visit (INDEPENDENT_AMBULATORY_CARE_PROVIDER_SITE_OTHER): Payer: BLUE CROSS/BLUE SHIELD | Admitting: Family

## 2014-08-17 ENCOUNTER — Encounter: Payer: Self-pay | Admitting: Family

## 2014-08-17 VITALS — BP 118/74 | HR 58 | Temp 98.7°F | Resp 18 | Ht 67.0 in | Wt 165.2 lb

## 2014-08-17 DIAGNOSIS — M549 Dorsalgia, unspecified: Secondary | ICD-10-CM | POA: Insufficient documentation

## 2014-08-17 DIAGNOSIS — M546 Pain in thoracic spine: Secondary | ICD-10-CM

## 2014-08-17 MED ORDER — NAPROXEN 500 MG PO TABS: 500.0000 mg | ORAL_TABLET | Freq: Two times a day (BID) | ORAL | Status: DC

## 2014-08-17 NOTE — Progress Notes (Signed)
   Subjective:    Patient ID: Daniel Jones, male    DOB: May 08, 1956, 59 y.o.   MRN: [id number]  Chief Complaint  Patient presents with  . Establish Care    having back pain behind shoulder blade, went somewhere to have it checked out recently    HPI:  Daniel Jones is a 59 y.o. male who presents today to establish care and discuss back pain.    Acute achy pain located between his shoulder blades which has been going on for about a month now. Was seen at an acute care facility and given diclofenac. Indicates the diclofenac has helped and there has been some overall general improvements. Indicates he was playing tennis when it initially started but does not recall any specific point of injury.   No Known Allergies.   Current Outpatient Prescriptions  Medication Sig Dispense Refill  . diclofenac (VOLTAREN) 75 MG EC tablet Take 75 mg by mouth 2 (two) times daily.     No current facility-administered medications for this visit.    Past Medical History  Diagnosis Date  . Allergic rhinitis due to pollen   . History of sexually transmitted disease     Review of Systems  Musculoskeletal: Positive for neck stiffness. Negative for neck pain.  Neurological: Negative for numbness.      Objective:    BP 118/74 mmHg  Pulse 58  Temp(Src) 98.7 F (37.1 C) (Oral)  Resp 18  Ht 5\' 7"  (1.702 m)  Wt 165 lb 3.2 oz (74.934 kg)  BMI 25.87 kg/m2  SpO2 98% Nursing note and vital signs reviewed.  Physical Exam  Constitutional: He is oriented to person, place, and time. He appears well-developed and well-nourished. No distress.  Neck: Normal range of motion.  Cardiovascular: Normal rate, regular rhythm, normal heart sounds and intact distal pulses.   Pulmonary/Chest: Effort normal and breath sounds normal.  Musculoskeletal:  No obvious deformity, discoloration, or edema noted of thoracic and cervical spine. No palpable tenderness elicited. Neck range of motion and thoracic range of  motion are intact and appropriate. Shoulder range of motion and shoulder strength are intact and appropriate.  Neurological: He is alert and oriented to person, place, and time.  Skin: Skin is warm and dry.  Psychiatric: He has a normal mood and affect. His behavior is normal. Judgment and thought content normal.       Assessment & Plan:

## 2014-08-17 NOTE — Assessment & Plan Note (Signed)
Symptoms appear resolved from previous treatment. Most likely a strain to the musculature. Discontinue diclofenac and start Naprosyn per patient request. Follow-up if symptoms return or worsen.

## 2014-08-17 NOTE — Patient Instructions (Signed)
Thank you for choosing [facility].  Summary/Instructions:  Please schedule a time for your physical.   Your prescription(s) have been submitted to your pharmacy or been printed and provided for you. Please take as directed and contact our office if you believe you are having problem(s) with the medication(s) or have any questions.  If your symptoms worsen or fail to improve, please contact our office for further instruction, or in case of emergency go directly to the emergency room at the closest medical facility.

## 2014-08-17 NOTE — Progress Notes (Signed)
Pre visit review using our clinic review tool, if applicable. No additional management support is needed unless otherwise documented below in the visit note. 

## 2014-08-28 ENCOUNTER — Encounter: Payer: BLUE CROSS/BLUE SHIELD | Admitting: Family

## 2014-09-24 ENCOUNTER — Other Ambulatory Visit: Payer: Self-pay | Admitting: Family

## 2015-09-24 ENCOUNTER — Ambulatory Visit (INDEPENDENT_AMBULATORY_CARE_PROVIDER_SITE_OTHER): Payer: BLUE CROSS/BLUE SHIELD | Admitting: Family

## 2015-09-24 ENCOUNTER — Other Ambulatory Visit (INDEPENDENT_AMBULATORY_CARE_PROVIDER_SITE_OTHER): Payer: BLUE CROSS/BLUE SHIELD

## 2015-09-24 ENCOUNTER — Encounter: Payer: Self-pay | Admitting: Family

## 2015-09-24 VITALS — BP 122/80 | HR 52 | Temp 98.3°F | Resp 16 | Ht 67.0 in | Wt 161.0 lb

## 2015-09-24 DIAGNOSIS — Z0000 Encounter for general adult medical examination without abnormal findings: Secondary | ICD-10-CM

## 2015-09-24 DIAGNOSIS — E119 Type 2 diabetes mellitus without complications: Secondary | ICD-10-CM | POA: Diagnosis not present

## 2015-09-24 DIAGNOSIS — Z23 Encounter for immunization: Secondary | ICD-10-CM | POA: Diagnosis not present

## 2015-09-24 LAB — CBC
HCT: 39.4 % (ref 39.0–52.0)
Hemoglobin: 13.3 g/dL (ref 13.0–17.0)
MCHC: 33.8 g/dL (ref 30.0–36.0)
MCV: 90.6 fl (ref 78.0–100.0)
Platelets: 344 10*3/uL (ref 150.0–400.0)
RBC: 4.35 Mil/uL (ref 4.22–5.81)
RDW: 12.9 % (ref 11.5–15.5)
WBC: 4.8 10*3/uL (ref 4.0–10.5)

## 2015-09-24 LAB — COMPREHENSIVE METABOLIC PANEL
ALT: 22 U/L (ref 0–53)
AST: 28 U/L (ref 0–37)
Albumin: 4.3 g/dL (ref 3.5–5.2)
Alkaline Phosphatase: 53 U/L (ref 39–117)
BILIRUBIN TOTAL: 0.5 mg/dL (ref 0.2–1.2)
BUN: 10 mg/dL (ref 6–23)
CALCIUM: 9.6 mg/dL (ref 8.4–10.5)
CO2: 28 meq/L (ref 19–32)
CREATININE: 0.85 mg/dL (ref 0.40–1.50)
Chloride: 107 mEq/L (ref 96–112)
GFR: 118.86 mL/min (ref >60.00)
GLUCOSE: 119 mg/dL — AB (ref 70–99)
Potassium: 4.2 mEq/L (ref 3.5–5.1)
Sodium: 141 mEq/L (ref 135–145)
Total Protein: 7.2 g/dL (ref 6.0–8.3)

## 2015-09-24 LAB — MICROALBUMIN / CREATININE URINE RATIO
CREATININE, U: 145 mg/dL
MICROALB/CREAT RATIO: 0.5 mg/g (ref 0.0–30.0)
Microalb, Ur: <0.7 mg/dL (ref 0.0–1.9)

## 2015-09-24 LAB — LIPID PANEL
CHOLESTEROL: 230 mg/dL — AB (ref 0–200)
HDL: 64 mg/dL (ref >39.00)
LDL Cholesterol: 131 mg/dL — ABNORMAL HIGH (ref 0–99)
NonHDL: 166.17
TRIGLYCERIDES: 177 mg/dL — AB (ref 0.0–149.0)
Total CHOL/HDL Ratio: 4
VLDL: 35.4 mg/dL (ref 0.0–40.0)

## 2015-09-24 LAB — HEMOGLOBIN A1C: HEMOGLOBIN A1C: 7.3 % — AB (ref 4.6–6.5)

## 2015-09-24 NOTE — Progress Notes (Signed)
Pre visit review using our clinic review tool, if applicable. No additional management support is needed unless otherwise documented below in the visit note. 

## 2015-09-24 NOTE — Assessment & Plan Note (Signed)
1) Anticipatory Guidance: Discussed importance of wearing a seatbelt while driving and not texting while driving; changing batteries in smoke detector at least once annually; wearing suntan lotion when outside; eating a balanced and moderate diet; getting physical activity at least 30 minutes per day.  2) Immunizations / Screenings / Labs:  Pneumovax updated today. Declines influenza. All other immunizations are up-to-date per recommendations. Diabetic foot exam completed today. Vision exam is scheduled in the next several weeks. Obtain urine microalbumin and hemoglobin A1c for diabetes screening. Obtain PSA for prostate cancer screening. Declines colonoscopy and would like to pursue Cologuard with paperwork completed and to be faxed. All other screenings are up-to-date per recommendations. Obtain CBC, BMET, Lipid profile and TSH.   Overall well exam with risk factors for cardiovascular disease including hyperlipidemia and type 2 diabetes. Diabetes not currently managed with medications. Treatment plan pending A1c results. Hyperlipidemia managed with lifestyle behaviors and not currently managed with medication. Treatment plan pending lipid profile. Encouraged to continue a nutritional intake that is moderate, varied, and balanced and focuses on nutrient dense foods and is low in saturated/processed sugary foods. Continue exercises of cardiovascular and resistance training with goal of increasing to 4 times per week. Continue other healthy lifestyle choices and behaviors. Follow-up prevention exam in 1 year. Follow-up office visit for chronic conditions pending blood work.

## 2015-09-24 NOTE — Patient Instructions (Signed)
Thank you for choosing [facility].  Summary/Instructions:  Please stop by the lab on the basement level of the building for your blood work. Your results will be released to MyChart (or called to you) after review, usually within 72 hours after test completion. If any changes need to be made, you will be notified at that same time.  Health Maintenance, Male A healthy lifestyle and preventative care can promote health and wellness.  Maintain regular health, dental, and eye exams.  Eat a healthy diet. Foods like vegetables, fruits, whole grains, low-fat dairy products, and lean protein foods contain the nutrients you need and are low in calories. Decrease your intake of foods high in solid fats, added sugars, and salt. Get information about a proper diet from your health care provider, if necessary.  Regular physical exercise is one of the most important things you can do for your health. Most adults should get at least 150 minutes of moderate-intensity exercise (any activity that increases your heart rate and causes you to sweat) each week. In addition, most adults need muscle-strengthening exercises on 2 or more days a week.   Maintain a healthy weight. The body mass index (BMI) is a screening tool to identify possible weight problems. It provides an estimate of body fat based on height and weight. Your health care provider can find your BMI and can help you achieve or maintain a healthy weight. For males 20 years and older:  A BMI below 18.5 is considered underweight.  A BMI of 18.5 to 24.9 is normal.  A BMI of 25 to 29.9 is considered overweight.  A BMI of 30 and above is considered obese.  Maintain normal blood lipids and cholesterol by exercising and minimizing your intake of saturated fat. Eat a balanced diet with plenty of fruits and vegetables. Blood tests for lipids and cholesterol should begin at age 20 and be repeated every 5 years. If your lipid or cholesterol levels are  high, you are over age 50, or you are at high risk for heart disease, you may need your cholesterol levels checked more frequently.Ongoing high lipid and cholesterol levels should be treated with medicines if diet and exercise are not working.  If you smoke, find out from your health care provider how to quit. If you do not use tobacco, do not start.  Lung cancer screening is recommended for adults aged 55-80 years who are at high risk for developing lung cancer because of a history of smoking. A yearly low-dose CT scan of the lungs is recommended for people who have at least a 30-pack-year history of smoking and are current smokers or have quit within the past 15 years. A pack year of smoking is smoking an average of 1 pack of cigarettes a day for 1 year (for example, a 30-pack-year history of smoking could mean smoking 1 pack a day for 30 years or 2 packs a day for 15 years). Yearly screening should continue until the smoker has stopped smoking for at least 15 years. Yearly screening should be stopped for people who develop a health problem that would prevent them from having lung cancer treatment.  If you choose to drink alcohol, do not have more than 2 drinks per day. One drink is considered to be 12 oz (360 mL) of beer, 5 oz (150 mL) of wine, or 1.5 oz (45 mL) of liquor.  Avoid the use of street drugs. Do not share needles with anyone. Ask for help if you need   support or instructions about stopping the use of drugs.  High blood pressure causes heart disease and increases the risk of stroke. High blood pressure is more likely to develop in:  People who have blood pressure in the end of the normal range (100-139/85-89 mm Hg).  People who are overweight or obese.  People who are African American.  If you are 18-39 years of age, have your blood pressure checked every 3-5 years. If you are 40 years of age or older, have your blood pressure checked every year. You should have your blood pressure  measured twice--once when you are at a hospital or clinic, and once when you are not at a hospital or clinic. Record the average of the two measurements. To check your blood pressure when you are not at a hospital or clinic, you can use:  An automated blood pressure machine at a pharmacy.  A home blood pressure monitor.  If you are 45-79 years old, ask your health care provider if you should take aspirin to prevent heart disease.  Diabetes screening involves taking a blood sample to check your fasting blood sugar level. This should be done once every 3 years after age 45 if you are at a normal weight and without risk factors for diabetes. Testing should be considered at a younger age or be carried out more frequently if you are overweight and have at least 1 risk factor for diabetes.  Colorectal cancer can be detected and often prevented. Most routine colorectal cancer screening begins at the age of 50 and continues through age 75. However, your health care provider may recommend screening at an earlier age if you have risk factors for colon cancer. On a yearly basis, your health care provider may provide home test kits to check for hidden blood in the stool. A small camera at the end of a tube may be used to directly examine the colon (sigmoidoscopy or colonoscopy) to detect the earliest forms of colorectal cancer. Talk to your health care provider about this at age 50 when routine screening begins. A direct exam of the colon should be repeated every 5-10 years through age 75, unless early forms of precancerous polyps or small growths are found.  People who are at an increased risk for hepatitis B should be screened for this virus. You are considered at high risk for hepatitis B if:  You were born in a country where hepatitis B occurs often. Talk with your health care provider about which countries are considered high risk.  Your parents were born in a high-risk country and you have not received a  shot to protect against hepatitis B (hepatitis B vaccine).  You have HIV or AIDS.  You use needles to inject street drugs.  You live with, or have sex with, someone who has hepatitis B.  You are a man who has sex with other men (MSM).  You get hemodialysis treatment.  You take certain medicines for conditions like cancer, organ transplantation, and autoimmune conditions.  Hepatitis C blood testing is recommended for all people born from 1945 through 1965 and any individual with known risk factors for hepatitis C.  Healthy men should no longer receive prostate-specific antigen (PSA) blood tests as part of routine cancer screening. Talk to your health care provider about prostate cancer screening.  Testicular cancer screening is not recommended for adolescents or adult males who have no symptoms. Screening includes self-exam, a health care provider exam, and other screening tests. Consult with your   health care provider about any symptoms you have or any concerns you have about testicular cancer.  Practice safe sex. Use condoms and avoid high-risk sexual practices to reduce the spread of sexually transmitted infections (STIs).  You should be screened for STIs, including gonorrhea and chlamydia if:  You are sexually active and are younger than 24 years.  You are older than 24 years, and your health care provider tells you that you are at risk for this type of infection.  Your sexual activity has changed since you were last screened, and you are at an increased risk for chlamydia or gonorrhea. Ask your health care provider if you are at risk.  If you are at risk of being infected with HIV, it is recommended that you take a prescription medicine daily to prevent HIV infection. This is called pre-exposure prophylaxis (PrEP). You are considered at risk if:  You are a man who has sex with other men (MSM).  You are a heterosexual man who is sexually active with multiple partners.  You take  drugs by injection.  You are sexually active with a partner who has HIV.  Talk with your health care provider about whether you are at high risk of being infected with HIV. If you choose to begin PrEP, you should first be tested for HIV. You should then be tested every 3 months for as long as you are taking PrEP.  Use sunscreen. Apply sunscreen liberally and repeatedly throughout the day. You should seek shade when your shadow is shorter than you. Protect yourself by wearing long sleeves, pants, a wide-brimmed hat, and sunglasses year round whenever you are outdoors.  Tell your health care provider of new moles or changes in moles, especially if there is a change in shape or color. Also, tell your health care provider if a mole is larger than the size of a pencil eraser.  A one-time screening for abdominal aortic aneurysm (AAA) and surgical repair of large AAAs by ultrasound is recommended for men aged 65-75 years who are current or former smokers.  Stay current with your vaccines (immunizations).   This information is not intended to replace advice given to you by your health care provider. Make sure you discuss any questions you have with your health care provider.   Document Released: 01/17/2008 Document Revised: 08/11/2014 Document Reviewed: 12/16/2010 Elsevier Interactive Patient Education 2016 Elsevier Inc.  

## 2015-09-24 NOTE — Progress Notes (Signed)
Subjective:    Patient ID: Daniel Jones, male    DOB: 11/01/1955, 60 y.o.   MRN: [id number]  Chief Complaint  Patient presents with  . CPE    Not fasting    HPI:  Daniel Jones is a 60 y.o. male who presents today for an annual wellness visit.   1) Health Maintenance -   Diet - Averages about 3 meals per day consisting of meat, fruit, vegetables, chicken and beef; 1-2 cups of caffiene daily.   Exercise - 3x per week - mixture of cardio and resistance training   2) Preventative Exams / Immunizations:  Dental --Scheduled  Vision -- Up to date   Health Maintenance  Topic Date Due  . Hepatitis C Screening  01/10/1956  . PNEUMOCOCCAL POLYSACCHARIDE VACCINE (1) 03/28/1959  . FOOT EXAM  03/28/1967  . OPHTHALMOLOGY EXAM  03/28/1967  . URINE MICROALBUMIN  03/28/1967  . HIV Screening  03/27/1972  . COLONOSCOPY  03/28/2007  . HEMOGLOBIN A1C  02/08/2014  . INFLUENZA VACCINE  04/18/2016 (Originally 03/05/2015)  . TETANUS/TDAP  01/23/2020    Immunization History  Administered Date(s) Administered  . Influenza Whole 01/22/2010  . Pneumococcal Polysaccharide-23 09/24/2015  . Td 11/03/1999, 01/22/2010   No Known Allergies   Outpatient Prescriptions Prior to Visit  Medication Sig Dispense Refill  . naproxen (NAPROSYN) 500 MG tablet Take 1 tablet (500 mg total) by mouth 2 (two) times daily with a meal. 60 tablet 0   No facility-administered medications prior to visit.     Past Medical History  Diagnosis Date  . Allergic rhinitis due to pollen   . History of sexually transmitted disease      Past Surgical History  Procedure Laterality Date  . Circumcision      1991     Family History  Problem Relation Age of Onset  . Cancer Father   . Diabetes Sister   . Gout Sister   . Heart disease Sister   . Diabetes Sister   . Hypertension Sister   . Hyperlipidemia Sister      Social History   Social History  . Marital Status: Single    Spouse Name: N/A   . Number of Children: 0  . Years of Education: 13   Occupational History  . Route driver    Social History Main Topics  . Smoking status: Former Smoker -- 0.25 packs/day for 30 years    Types: Cigarettes    Quit date: 03/12/2011  . Smokeless tobacco: Never Used  . Alcohol Use: No  . Drug Use: No  . Sexual Activity:    Partners: Female   Other Topics Concern  . Not on file   Social History Narrative   HSG. [employer] - 2 years - diploma. Married '94- divorced '97. No children.  Lives alone. 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 HENT: Head: Denies headache or neck pain Ears: Denies changes in hearing, ringing in ears, earache, drainage Nose: Denies discharge, stuffiness, itching, nosebleed, sinus pain Throat: Denies sore throat, hoarseness, dry mouth, sores, thrush Eyes: Denies loss/changes in vision, pain, redness, blurry/double vision, flashing lights Cardiovascular: Denies chest pain/discomfort, tightness, palpitations, shortness of breath with activity, difficulty lying down, swelling, sudden awakening with shortness of breath Respiratory: Denies shortness of breath, cough, sputum production, wheezing Gastrointestinal: Denies dysphasia, heartburn, change in appetite, nausea, change in bowel habits, rectal bleeding, constipation, diarrhea, yellow  skin or eyes Genitourinary: Denies frequency, urgency, burning/pain, blood in urine, incontinence, change in urinary strength. Musculoskeletal: Denies muscle/joint pain, stiffness, back pain, redness or swelling of joints, trauma Skin: Denies rashes, lumps, itching, dryness, color changes, or hair/nail changes Neurological: Denies dizziness, fainting, seizures, weakness, numbness, tingling, tremor Psychiatric - Denies nervousness, stress, depression or memory loss Endocrine: Denies heat or cold intolerance, sweating, frequent  urination, excessive thirst, changes in appetite Hematologic: Denies ease of bruising or bleeding     Objective:     BP 122/80 mmHg  Pulse 52  Temp(Src) 98.3 F (36.8 C) (Oral)  Resp 16  Ht 5\' 7"  (1.702 m)  Wt 161 lb (73.029 kg)  BMI 25.21 kg/m2  SpO2 96% Nursing note and vital signs reviewed.  Physical Exam  Constitutional: He is oriented to person, place, and time. He appears well-developed and well-nourished.  HENT:  Head: Normocephalic.  Right Ear: Hearing, tympanic membrane, external ear and ear canal normal.  Left Ear: Hearing, tympanic membrane, external ear and ear canal normal.  Nose: Nose normal.  Mouth/Throat: Uvula is midline, oropharynx is clear and moist and mucous membranes are normal.  Eyes: Conjunctivae and EOM are normal. Pupils are equal, round, and reactive to light.  Neck: Neck supple. No JVD present. No tracheal deviation present. No thyromegaly present.  Cardiovascular: Normal rate, regular rhythm, normal heart sounds and intact distal pulses.   Pulmonary/Chest: Effort normal and breath sounds normal.  Abdominal: Soft. Bowel sounds are normal. He exhibits no distension and no mass. There is no tenderness. There is no rebound and no guarding.  Musculoskeletal: Normal range of motion. He exhibits no edema or tenderness.  Lymphadenopathy:    He has no cervical adenopathy.  Neurological: He is alert and oriented to person, place, and time. He has normal reflexes. No cranial nerve deficit. He exhibits normal muscle tone. Coordination normal.  Diabetic Foot Exam - Simple   Simple Foot Form  Diabetic Foot exam was performed with the following findings:  Yes  09/24/2015  4:42 PM  Visual Inspection  No deformities, no ulcerations, no other skin breakdown bilaterally:  Yes  Sensation Testing  Intact to touch and monofilament testing bilaterally:  Yes  Pulse Check  Posterior Tibialis and Dorsalis pulse intact bilaterally:  Yes   Skin: Skin is warm and dry.    Psychiatric: He has a normal mood and affect. His behavior is normal. Judgment and thought content normal.       Assessment & Plan:   Problem List Items Addressed This Visit      Endocrine   Type 2 diabetes mellitus (HCC)   Relevant Orders   Hemoglobin A1c   Microalbumin / creatinine urine ratio     Other   Routine general medical examination at a health care facility - Primary    1) Anticipatory Guidance: Discussed importance of wearing a seatbelt while driving and not texting while driving; changing batteries in smoke detector at least once annually; wearing suntan lotion when outside; eating a balanced and moderate diet; getting physical activity at least 30 minutes per day.  2) Immunizations / Screenings / Labs:  Pneumovax updated today. Declines influenza. All other immunizations are up-to-date per recommendations. Diabetic foot exam completed today. Vision exam is scheduled in the next several weeks. Obtain urine microalbumin and hemoglobin A1c for diabetes screening. Obtain PSA for prostate cancer screening. Declines colonoscopy and would like to pursue Cologuard with paperwork completed and to be faxed. All other screenings are up-to-date per recommendations.  Obtain CBC, BMET, Lipid profile and TSH.   Overall well exam with risk factors for cardiovascular disease including hyperlipidemia and type 2 diabetes. Diabetes not currently managed with medications. Treatment plan pending A1c results. Hyperlipidemia managed with lifestyle behaviors and not currently managed with medication. Treatment plan pending lipid profile. Encouraged to continue a nutritional intake that is moderate, varied, and balanced and focuses on nutrient dense foods and is low in saturated/processed sugary foods. Continue exercises of cardiovascular and resistance training with goal of increasing to 4 times per week. Continue other healthy lifestyle choices and behaviors. Follow-up prevention exam in 1 year.  Follow-up office visit for chronic conditions pending blood work.      Relevant Orders   Comprehensive metabolic panel   CBC   Lipid panel   PSA   TSH   Hemoglobin A1c    Other Visit Diagnoses    Need for 23-polyvalent pneumococcal polysaccharide vaccine        Relevant Orders    Pneumococcal polysaccharide vaccine 23-valent greater than or equal to 2yo subcutaneous/IM (Completed)

## 2015-09-25 ENCOUNTER — Telehealth: Payer: Self-pay | Admitting: Family

## 2015-09-25 LAB — TSH: TSH: 0.76 u[IU]/mL (ref 0.35–4.50)

## 2015-09-25 LAB — PSA: PSA: 0.46 ng/mL (ref 0.10–4.00)

## 2015-09-25 NOTE — Telephone Encounter (Signed)
Please inform patient that his blood work shows that his prostate, thyroid function, kidney function, liver function, white/red blood cells and electrolytes are all within the normal ranges. His A1c is elevated at 7.3 indicating uncontrolled diabetes. I would recommend starting a medication called metformin to help lower his blood sugars below 6.5 if we are able. Lastly, his cholesterol was elevated at 131 with a goal less than 100. Based on calculations his risk factors for coronary artery disease in the next 10 years is 9% which is greater than the threshold of 7.5%. This information combined with his diagnosis of diabetes greatly increases his risk for cardiovascular disease with the recommendation that we start him on cholesterol medication as well. I would recommend starting either atorvastatin or pravastatin. This is a lot of information to absorb it 1 time. If he is willing, I will send in a prescriptions for the 2 medications otherwise and recommended follow-up office visit so that I can answer any questions that he may have.

## 2015-09-27 NOTE — Telephone Encounter (Signed)
Pt is aware of results. He is interested in starting both the diabetic and cholesterol medication. Please send it in when you can.

## 2015-09-28 MED ORDER — PRAVASTATIN SODIUM 20 MG PO TABS: 20.0000 mg | ORAL_TABLET | Freq: Every day | ORAL | Status: DC

## 2015-09-28 MED ORDER — METFORMIN HCL 500 MG PO TABS: 500.0000 mg | ORAL_TABLET | Freq: Two times a day (BID) | ORAL | Status: DC

## 2015-09-28 NOTE — Telephone Encounter (Signed)
Medication sent to pharmacy  

## 2015-10-01 NOTE — Telephone Encounter (Signed)
General Recommendations:    Please drink plenty of fluids.  Get plenty of rest   Sleep in humidified air  Use saline nasal sprays  Netti pot   OTC Medications:  Decongestants - helps relieve congestion   Flonase (generic fluticasone) or Nasacort (generic triamcinolone) - please make sure to use the "cross-over" technique at a 45 degree angle towards the opposite eye as opposed to straight up the nasal passageway.   Sudafed (generic pseudoephedrine - Note this is the one that is available behind the pharmacy counter); Products with phenylephrine (-PE) may also be used but is often not as effective as pseudoephedrine.   If you have HIGH BLOOD PRESSURE - Coricidin HBP; AVOID any product that is -D as this contains pseudoephedrine which may increase your blood pressure.  Afrin (oxymetazoline) every 6-8 hours for up to 3 days.   Allergies - helps relieve runny nose, itchy eyes and sneezing   Claritin (generic loratidine), Allegra (fexofenidine), or Zyrtec (generic cyrterizine) for runny nose. These medications should not cause drowsiness.  Note - Benadryl (generic diphenhydramine) may be used however may cause drowsiness  Cough -   Delsym or Robitussin (generic dextromethorphan)  Expectorants - helps loosen mucus to ease removal   Mucinex (generic guaifenesin) as directed on the package.  Headaches / General Aches   Tylenol (generic acetaminophen) - DO NOT EXCEED 3 grams (3,000 mg) in a 24 hour time period  Advil/Motrin (generic ibuprofen)   Sore Throat -   Salt water gargle   Chloraseptic (generic benzocaine) spray or lozenges / Sucrets (generic dyclonine)

## 2015-10-01 NOTE — Telephone Encounter (Signed)
Pt is aware. He states that he has had cold like sxs for 2 days. Cough, sore throat, runny nose and has been feeling feverish. Is there anything you can recommend OTC for him to take. There are no openings available at this time. Please advise

## 2015-10-02 NOTE — Telephone Encounter (Signed)
Pt is aware.  

## 2015-12-29 ENCOUNTER — Other Ambulatory Visit: Payer: Self-pay | Admitting: Family

## 2016-03-26 ENCOUNTER — Other Ambulatory Visit: Payer: Self-pay | Admitting: Family

## 2016-04-12 ENCOUNTER — Other Ambulatory Visit: Payer: Self-pay | Admitting: Family

## 2016-04-15 ENCOUNTER — Ambulatory Visit (INDEPENDENT_AMBULATORY_CARE_PROVIDER_SITE_OTHER): Payer: BLUE CROSS/BLUE SHIELD | Admitting: Internal Medicine

## 2016-04-15 ENCOUNTER — Encounter: Payer: Self-pay | Admitting: Internal Medicine

## 2016-04-15 VITALS — BP 120/78 | HR 71 | Temp 98.0°F | Resp 20 | Wt 163.0 lb

## 2016-04-15 DIAGNOSIS — G245 Blepharospasm: Secondary | ICD-10-CM

## 2016-04-15 DIAGNOSIS — E1310 Other specified diabetes mellitus with ketoacidosis without coma: Secondary | ICD-10-CM | POA: Diagnosis not present

## 2016-04-15 DIAGNOSIS — E1110 Type 2 diabetes mellitus with ketoacidosis without coma: Secondary | ICD-10-CM

## 2016-04-15 DIAGNOSIS — E119 Type 2 diabetes mellitus without complications: Secondary | ICD-10-CM | POA: Diagnosis not present

## 2016-04-15 DIAGNOSIS — L309 Dermatitis, unspecified: Secondary | ICD-10-CM

## 2016-04-15 MED ORDER — CLOBETASOL PROPIONATE 0.05 % EX CREA: 1.0000 "application " | TOPICAL_CREAM | Freq: Two times a day (BID) | CUTANEOUS | 0 refills | Status: DC

## 2016-04-15 NOTE — Patient Instructions (Signed)
Please take all new medication as prescribed - the cream for the rash  Please call if the eyelid spasms become worse  Please continue all other medications as before, and refills have been done if requested.  Please have the pharmacy call with any other refills you may need.  Please continue your efforts at being more active, low cholesterol diabetic diet, and weight control.  Please keep your appointments with your specialists as you may have planned  Please go to the LAB in the Basement (turn left off the elevator) for the tests to be done tomorrow  You will be contacted by phone if any changes need to be made immediately.  Otherwise, you will receive a letter about your results with an explanation, but please check with MyChart first.  Please remember to sign up for MyChart if you have not done so, as this will be important to you in the future with finding out test results, communicating by private email, and scheduling acute appointments online when needed.  Please return in 6 months, or sooner if needed, to [facility]

## 2016-04-15 NOTE — Progress Notes (Signed)
Subjective:    Patient ID: Daniel Jones, male    DOB: 06-Jul-1956, 60 y.o.   MRN: [id number]  HPI  Here with 3rd episode mild itchy scaly rash about the neck, x 3 mo,? Some detergent effect but no change or other rash, no fever, pain or drainage.  Nothing seems to make better, scratching makes better a short time    Also with c/o occasional upper eyelid spasms but few, rare, just wondering if could be serious.  No pain, swelling, no HA and Pt denies new neurological symptoms such as new headache, or facial or extremity weakness or numbness     Pt denies polydipsia, polyuria, or low sugar symptoms such as weakness or confusion improved with po intake.  Pt states overall good compliance with meds, trying to follow lower cholesterol, diabetic diet, wt overall stable but little exercise however.    Past Medical History:  Diagnosis Date  . Allergic rhinitis due to pollen   . History of sexually transmitted disease    Past Surgical History:  Procedure Laterality Date  . CIRCUMCISION     1991    reports that he quit smoking about 5 years ago. His smoking use included Cigarettes. He has a 7.50 pack-year smoking history. He has never used smokeless tobacco. He reports that he does not drink alcohol or use drugs. family history includes Cancer in his father; Diabetes in his sister and sister; Gout in his sister; Heart disease in his sister; Hyperlipidemia in his sister; Hypertension in his sister. No Known Allergies Current Outpatient Prescriptions on File Prior to Visit  Medication Sig Dispense Refill  . metFORMIN (GLUCOPHAGE) 500 MG tablet TAKE 1 TABLET (500 MG TOTAL) BY MOUTH 2 (TWO) TIMES DAILY WITH A MEAL. 60 tablet 2  . naproxen (NAPROSYN) 500 MG tablet Take 1 tablet (500 mg total) by mouth 2 (two) times daily with a meal. 60 tablet 0  . pravastatin (PRAVACHOL) 20 MG tablet TAKE 1 TABLET (20 MG TOTAL) BY MOUTH DAILY. 30 tablet 2   No current facility-administered medications on file prior  to visit.    Review of Systems  Constitutional: Negative for unusual diaphoresis or night sweats HENT: Negative for ear swelling or discharge Eyes: Negative for worsening visual haziness  Respiratory: Negative for choking and stridor.   Gastrointestinal: Negative for distension or worsening eructation Genitourinary: Negative for retention or change in urine volume.  Musculoskeletal: Negative for other MSK pain or swelling Skin: Negative for color change and worsening wound Neurological: Negative for tremors and numbness other than noted  Psychiatric/Behavioral: Negative for decreased concentration or agitation other than above       Objective:   Physical Exam BP 120/78   Pulse 71   Temp 98 F (36.7 C) (Oral)   Resp 20   Wt 163 lb (73.9 kg)   SpO2 97%   BMI 25.53 kg/m  VS noted,  Constitutional: Pt appears in no apparent distress HENT: Head: NCAT.  Right Ear: External ear normal.  Left Ear: External ear normal.  Eyes: . Pupils are equal, round, and reactive to light. Conjunctivae and EOM are normal Neck: Normal range of motion. Neck supple.  Cardiovascular: Normal rate and regular rhythm.   Pulmonary/Chest: Effort normal and breath sounds without rales or wheezing.  Neurological: Pt is alert. Not confused , motor grossly intact Skin: Skin is warm. , no LE edema, has scaly silvery nontender nonraised rash ringlike about the neck in a circular fashion Psychiatric: Pt behavior is  normal. No agitation.     Assessment & Plan:

## 2016-04-15 NOTE — Progress Notes (Signed)
Pre visit review using our clinic review tool, if applicable. No additional management support is needed unless otherwise documented below in the visit note. 

## 2016-04-16 ENCOUNTER — Other Ambulatory Visit (INDEPENDENT_AMBULATORY_CARE_PROVIDER_SITE_OTHER): Payer: BLUE CROSS/BLUE SHIELD

## 2016-04-16 ENCOUNTER — Encounter: Payer: Self-pay | Admitting: Internal Medicine

## 2016-04-16 DIAGNOSIS — E1310 Other specified diabetes mellitus with ketoacidosis without coma: Secondary | ICD-10-CM

## 2016-04-16 DIAGNOSIS — E1110 Type 2 diabetes mellitus with ketoacidosis without coma: Secondary | ICD-10-CM

## 2016-04-16 LAB — LIPID PANEL
CHOL/HDL RATIO: 3
Cholesterol: 172 mg/dL (ref 0–200)
HDL: 60 mg/dL (ref >39.00)
LDL CALC: 91 mg/dL (ref 0–99)
NONHDL: 112.25
TRIGLYCERIDES: 105 mg/dL (ref 0.0–149.0)
VLDL: 21 mg/dL (ref 0.0–40.0)

## 2016-04-16 LAB — BASIC METABOLIC PANEL
BUN: 8 mg/dL (ref 6–23)
CHLORIDE: 108 meq/L (ref 96–112)
CO2: 29 mEq/L (ref 19–32)
Calcium: 8.8 mg/dL (ref 8.4–10.5)
Creatinine, Ser: 0.93 mg/dL (ref 0.40–1.50)
GFR: 106.93 mL/min (ref >60.00)
Glucose, Bld: 108 mg/dL — ABNORMAL HIGH (ref 70–99)
POTASSIUM: 4.2 meq/L (ref 3.5–5.1)
SODIUM: 142 meq/L (ref 135–145)

## 2016-04-16 LAB — HEPATIC FUNCTION PANEL
ALK PHOS: 56 U/L (ref 39–117)
ALT: 21 U/L (ref 0–53)
AST: 24 U/L (ref 0–37)
Albumin: 4.1 g/dL (ref 3.5–5.2)
BILIRUBIN DIRECT: 0.1 mg/dL (ref 0.0–0.3)
BILIRUBIN TOTAL: 0.4 mg/dL (ref 0.2–1.2)
Total Protein: 7.1 g/dL (ref 6.0–8.3)

## 2016-04-16 LAB — HEMOGLOBIN A1C: Hgb A1c MFr Bld: 6.7 % — ABNORMAL HIGH (ref 4.6–6.5)

## 2016-04-20 NOTE — Assessment & Plan Note (Signed)
None by exam, very mild by hx, cant f/o simple fasculations as well, no specific tx needed at this time,  to f/u any worsening symptoms or concerns

## 2016-04-20 NOTE — Assessment & Plan Note (Signed)
C/w eczema, Ok for clobetasol prn,  to f/u any worsening symptoms or concerns

## 2016-06-20 ENCOUNTER — Other Ambulatory Visit: Payer: Self-pay | Admitting: Family

## 2016-06-24 ENCOUNTER — Other Ambulatory Visit: Payer: Self-pay | Admitting: Family

## 2016-06-27 DIAGNOSIS — S46811A Strain of other muscles, fascia and tendons at shoulder and upper arm level, right arm, initial encounter: Secondary | ICD-10-CM | POA: Diagnosis not present

## 2016-06-27 DIAGNOSIS — M25511 Pain in right shoulder: Secondary | ICD-10-CM | POA: Diagnosis not present

## 2016-08-05 DIAGNOSIS — M79601 Pain in right arm: Secondary | ICD-10-CM | POA: Diagnosis not present

## 2016-08-05 DIAGNOSIS — M436 Torticollis: Secondary | ICD-10-CM | POA: Diagnosis not present

## 2016-08-05 DIAGNOSIS — M25511 Pain in right shoulder: Secondary | ICD-10-CM | POA: Diagnosis not present

## 2016-08-16 ENCOUNTER — Other Ambulatory Visit: Payer: Self-pay | Admitting: Family

## 2016-09-20 ENCOUNTER — Other Ambulatory Visit: Payer: Self-pay | Admitting: Family

## 2016-10-16 ENCOUNTER — Other Ambulatory Visit: Payer: Self-pay | Admitting: Family

## 2016-11-07 ENCOUNTER — Telehealth: Payer: Self-pay | Admitting: Family

## 2016-11-12 MED ORDER — METFORMIN HCL 500 MG PO TABS: 500.0000 mg | ORAL_TABLET | Freq: Two times a day (BID) | ORAL | 0 refills | Status: DC

## 2016-11-12 NOTE — Addendum Note (Signed)
Addended by: Mercer Pod E on: 11/12/2016 12:57 PM   Modules accepted: Orders

## 2016-11-12 NOTE — Telephone Encounter (Signed)
Rx sent 

## 2016-11-12 NOTE — Telephone Encounter (Signed)
Pt called about the refill on his Metformin. I scheduled a physical for him on Monday (11/17/16). Can Tammy Sours send in a small supply of the Metformin to get him through until Monday? Please advise.

## 2016-11-17 ENCOUNTER — Encounter: Payer: Self-pay | Admitting: Family

## 2016-11-17 ENCOUNTER — Ambulatory Visit (INDEPENDENT_AMBULATORY_CARE_PROVIDER_SITE_OTHER): Payer: BLUE CROSS/BLUE SHIELD | Admitting: Family

## 2016-11-17 VITALS — BP 122/80 | HR 59 | Temp 98.3°F | Resp 16 | Ht 67.0 in | Wt 165.8 lb

## 2016-11-17 DIAGNOSIS — E785 Hyperlipidemia, unspecified: Secondary | ICD-10-CM | POA: Diagnosis not present

## 2016-11-17 DIAGNOSIS — Z0000 Encounter for general adult medical examination without abnormal findings: Secondary | ICD-10-CM | POA: Diagnosis not present

## 2016-11-17 DIAGNOSIS — E119 Type 2 diabetes mellitus without complications: Secondary | ICD-10-CM | POA: Diagnosis not present

## 2016-11-17 DIAGNOSIS — Z7289 Other problems related to lifestyle: Secondary | ICD-10-CM

## 2016-11-17 NOTE — Assessment & Plan Note (Signed)
Maintained on pravastatin with no adverse side effects. Lipid profile obtained. Continue current dosage of pravastatin pending lipid profile results.

## 2016-11-17 NOTE — Progress Notes (Signed)
Subjective:    Patient ID: Daniel Jones, male    DOB: 06-10-1956, 61 y.o.   MRN: [id number]  Chief Complaint  Patient presents with  . CPE    not fasting, cologuard    HPI:  Daniel Jones is a 61 y.o. male who presents today for an annual wellness visit.   1) Health Maintenance -   Diet - Averaging about 3 meals per day consisting of a regular diet; Caffeine intake of about 1-2 cups daily.   Exercise - 4x per week; mixture of cardio and resistance training   2) Preventative Exams / Immunizations:  Dental -- Due for exam   Vision -- Due for exam   Health Maintenance  Topic Date Due  . Hepatitis C Screening  [birth year]  . OPHTHALMOLOGY EXAM  03/28/1967  . HIV Screening  03/27/1972  . COLONOSCOPY  03/28/2007  . FOOT EXAM  09/23/2016  . URINE MICROALBUMIN  09/23/2016  . HEMOGLOBIN A1C  10/14/2016  . INFLUENZA VACCINE  03/04/2017  . TETANUS/TDAP  01/23/2020  . PNEUMOCOCCAL POLYSACCHARIDE VACCINE (2) 09/23/2020    Immunization History  Administered Date(s) Administered  . Influenza Whole 01/22/2010  . Pneumococcal Polysaccharide-23 09/24/2015  . Td 11/03/1999, 01/22/2010     No Known Allergies   Outpatient Medications Prior to Visit  Medication Sig Dispense Refill  . clobetasol cream (TEMOVATE) 0.05 % Apply 1 application topically 2 (two) times daily. 30 g 0  . metFORMIN (GLUCOPHAGE) 500 MG tablet Take 1 tablet (500 mg total) by mouth 2 (two) times daily with a meal. Needs an office visit for more refills 60 tablet 0  . naproxen (NAPROSYN) 500 MG tablet Take 1 tablet (500 mg total) by mouth 2 (two) times daily with a meal. 60 tablet 0  . pravastatin (PRAVACHOL) 20 MG tablet TAKE 1 TABLET (20 MG TOTAL) BY MOUTH DAILY. 30 tablet 6   No facility-administered medications prior to visit.      Past Medical History:  Diagnosis Date  . Allergic rhinitis due to pollen   . History of sexually transmitted disease      Past Surgical History:  Procedure  Laterality Date  . CIRCUMCISION     1991     Family History  Problem Relation Age of Onset  . Cancer Father   . Diabetes Sister   . Gout Sister   . Heart disease Sister   . Diabetes Sister   . Hypertension Sister   . Hyperlipidemia Sister      Social History   Social History  . Marital status: Single    Spouse name: N/A  . Number of children: 0  . Years of education: 87   Occupational History  . Route driver    Social History Main Topics  . Smoking status: Former Smoker    Packs/day: 0.25    Years: 30.00    Types: Cigarettes    Quit date: 03/12/2011  . Smokeless tobacco: Never Used  . Alcohol use No  . Drug use: No  . Sexual activity: Yes    Partners: Female   Other Topics Concern  . Not on file   Social History Narrative   HSG. [employer] - 2 years - diploma. Married '94- divorced '97. No children.  Lives alone. 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 HENT: Head: Denies headache or neck pain Ears: Denies changes in hearing, ringing in ears,  earache, drainage Nose: Denies discharge, stuffiness, itching, nosebleed, sinus pain Throat: Denies sore throat, hoarseness, dry mouth, sores, thrush Eyes: Denies loss/changes in vision, pain, redness, blurry/double vision, flashing lights Cardiovascular: Denies chest pain/discomfort, tightness, palpitations, shortness of breath with activity, difficulty lying down, swelling, sudden awakening with shortness of breath Respiratory: Denies shortness of breath, cough, sputum production, wheezing Gastrointestinal: Denies dysphasia, heartburn, change in appetite, nausea, change in bowel habits, rectal bleeding, constipation, diarrhea, yellow skin or eyes Genitourinary: Denies frequency, urgency, burning/pain, blood in urine, incontinence, change in urinary strength. Musculoskeletal: Denies muscle/joint pain, stiffness, back  pain, redness or swelling of joints, trauma Skin: Denies rashes, lumps, itching, dryness, color changes, or hair/nail changes Neurological: Denies dizziness, fainting, seizures, weakness, numbness, tingling, tremor Psychiatric - Denies nervousness, stress, depression or memory loss Endocrine: Denies heat or cold intolerance, sweating, frequent urination, excessive thirst, changes in appetite Hematologic: Denies ease of bruising or bleeding     Objective:     BP 122/80 (BP Location: Left Arm, Patient Position: Sitting, Cuff Size: Normal)   Pulse (!) 59   Temp 98.3 F (36.8 C) (Oral)   Resp 16   Ht  (1.702 m)   Wt 165 lb 12.8 oz (75.2 kg)   SpO2 98%   BMI 25.97 kg/m  Nursing note and vital signs reviewed.  Physical Exam  Constitutional: He is oriented to person, place, and time. He appears well-developed and well-nourished. No distress.  HENT:  Head: Normocephalic.  Right Ear: Hearing, tympanic membrane, external ear and ear canal normal.  Left Ear: Hearing, tympanic membrane, external ear and ear canal normal.  Nose: Nose normal.  Mouth/Throat: Uvula is midline, oropharynx is clear and moist and mucous membranes are normal.  Eyes: Conjunctivae and EOM are normal. Pupils are equal, round, and reactive to light.  Arcus senilis bilaterally  Neck: Neck supple. No JVD present. No tracheal deviation present. No thyromegaly present.  Cardiovascular: Normal rate, regular rhythm, normal heart sounds and intact distal pulses.   Pulmonary/Chest: Effort normal and breath sounds normal.  Abdominal: Soft. Bowel sounds are normal. He exhibits no distension and no mass. There is no tenderness. There is no rebound and no guarding.  Musculoskeletal: Normal range of motion. He exhibits no edema or tenderness.  Lymphadenopathy:    He has no cervical adenopathy.  Neurological: He is alert and oriented to person, place, and time. He has normal reflexes. No cranial nerve deficit. He exhibits  normal muscle tone. Coordination normal.  Skin: Skin is warm and dry.  Psychiatric: He has a normal mood and affect. His behavior is normal. Judgment and thought content normal.       Assessment & Plan:   Problem List Items Addressed This Visit      Endocrine   Type 2 diabetes mellitus (HCC)    Most recent A1c of 6.7 with current dosage of metformin. Obtain hemoglobin A1c and urine microalbumin. Diabetic foot exam completed today. Pneumovax is up-to-date. Maintained on pravastatin for CAD risk reduction. Encouraged to complete diabetic eye exam independently. Continue to monitor blood sugars at home. Continue current dosage of metformin. Follow up pending A1c results.       Relevant Orders   Hemoglobin A1c   Urine Microalbumin w/creat. ratio     Other   Hyperlipidemia with target low density lipoprotein (LDL) cholesterol less than 100 mg/dL    Maintained on pravastatin with no adverse side effects. Lipid profile obtained. Continue current dosage of pravastatin pending lipid profile results.  Routine general medical examination at a health care facility - Primary    1) Anticipatory Guidance: Discussed importance of wearing a seatbelt while driving and not texting while driving; changing batteries in smoke detector at least once annually; wearing suntan lotion when outside; eating a balanced and moderate diet; getting physical activity at least 30 minutes per day.  2) Immunizations / Screenings / Labs:  All immunizations are up-to-date per recommendations. Due for dental and vision exam encouraged to be completed independently. Obtain hepatitis C antibody for hepatitis C screening. Patient declines colonoscopy for colon cancer screening with agreement for Cologuard. Obtain PSA for prostate cancer screening. All other screenings are up-to-date per recommendations. Obtain CBC, CMET, and lipid profile.   Overall well exam with risk factors for cardiovascular disease including type 2  diabetes, hyperlipidemia, and previous tobacco use. Chronic conditions managed through medication and lifestyle. He is of good weight. Exercises regularly. Continue other healthy lifestyle behaviors and choices. Follow-up prevention exam in 1 year. Follow-up office visit pending blood work and for chronic conditions.       Relevant Orders   CBC   Comprehensive metabolic panel   Lipid panel   PSA    Other Visit Diagnoses    Other problems related to lifestyle       Relevant Orders   Hepatitis C antibody       I am having Mr. Lacher maintain his naproxen, clobetasol cream, pravastatin, and metFORMIN.   Follow-up: Return in about 4 months (around 03/19/2017), or if symptoms worsen or fail to improve.   Jeanine Luz, FNP

## 2016-11-17 NOTE — Patient Instructions (Signed)
Thank you for choosing [facility].  SUMMARY AND INSTRUCTIONS:  Please continue to take your medication as prescribed.  Cologuard will be in touch for your colon cancer screening.  Medication:  Your prescription(s) have been submitted to your pharmacy or been printed and provided for you. Please take as directed and contact our office if you believe you are having problem(s) with the medication(s) or have any questions.  Labs:  Please stop by the lab on the lower level of the building for your blood work. Your results will be released to MyChart (or called to you) after review, usually within 72 hours after test completion. If any changes need to be made, you will be notified at that same time.  1.) The lab is open from 7:30am to 5:30 pm Monday-Friday 2.) No appointment is necessary 3.) Fasting (if needed) is 6-8 hours after food and drink; black coffee and water are okay   Follow up:  If your symptoms worsen or fail to improve, please contact our office for further instruction, or in case of emergency go directly to the emergency room at the closest medical facility.    Health Maintenance, Male A healthy lifestyle and preventive care is important for your health and wellness. Ask your health care provider about what schedule of regular examinations is right for you. What should I know about weight and diet?  Eat a Healthy Diet  Eat plenty of vegetables, fruits, whole grains, low-fat dairy products, and lean protein.  Do not eat a lot of foods high in solid fats, added sugars, or salt. Maintain a Healthy Weight  Regular exercise can help you achieve or maintain a healthy weight. You should:  Do at least 150 minutes of exercise each week. The exercise should increase your heart rate and make you sweat (moderate-intensity exercise).  Do strength-training exercises at least twice a week. Watch Your Levels of Cholesterol and Blood Lipids  Have your blood tested for lipids  and cholesterol every 5 years starting at 60 years of age. If you are at high risk for heart disease, you should start having your blood tested when you are 60 years old. You may need to have your cholesterol levels checked more often if:  Your lipid or cholesterol levels are high.  You are older than 60 years of age.  You are at high risk for heart disease. What should I know about cancer screening? Many types of cancers can be detected early and may often be prevented. Lung Cancer  You should be screened every year for lung cancer if:  You are a current smoker who has smoked for at least 30 years.  You are a former smoker who has quit within the past 15 years.  Talk to your health care provider about your screening options, when you should start screening, and how often you should be screened. Colorectal Cancer  Routine colorectal cancer screening usually begins at 60 years of age and should be repeated every 5-10 years until you are 60 years old. You may need to be screened more often if early forms of precancerous polyps or small growths are found. Your health care provider may recommend screening at an earlier age if you have risk factors for colon cancer.  Your health care provider may recommend using home test kits to check for hidden blood in the stool.  A small camera at the end of a tube can be used to examine your colon (sigmoidoscopy or colonoscopy). This checks for the  earliest forms of colorectal cancer. Prostate and Testicular Cancer  Depending on your age and overall health, your health care provider may do certain tests to screen for prostate and testicular cancer.  Talk to your health care provider about any symptoms or concerns you have about testicular or prostate cancer. Skin Cancer  Check your skin from head to toe regularly.  Tell your health care provider about any new moles or changes in moles, especially if:  There is a change in a mole's size, shape, or  color.  You have a mole that is larger than a pencil eraser.  Always use sunscreen. Apply sunscreen liberally and repeat throughout the day.  Protect yourself by wearing long sleeves, pants, a wide-brimmed hat, and sunglasses when outside. What should I know about heart disease, diabetes, and high blood pressure?  If you are 5-14 years of age, have your blood pressure checked every 3-5 years. If you are 62 years of age or older, have your blood pressure checked every year. You should have your blood pressure measured twice-once when you are at a hospital or clinic, and once when you are not at a hospital or clinic. Record the average of the two measurements. To check your blood pressure when you are not at a hospital or clinic, you can use:  An automated blood pressure machine at a pharmacy.  A home blood pressure monitor.  Talk to your health care provider about your target blood pressure.  If you are between 67-65 years old, ask your health care provider if you should take aspirin to prevent heart disease.  Have regular diabetes screenings by checking your fasting blood sugar level.  If you are at a normal weight and have a low risk for diabetes, have this test once every three years after the age of 60.  If you are overweight and have a high risk for diabetes, consider being tested at a younger age or more often.  A one-time screening for abdominal aortic aneurysm (AAA) by ultrasound is recommended for men aged 65-75 years who are current or former smokers. What should I know about preventing infection? Hepatitis B  If you have a higher risk for hepatitis B, you should be screened for this virus. Talk with your health care provider to find out if you are at risk for hepatitis B infection. Hepatitis C  Blood testing is recommended for:  Everyone born from [birth year] through 1964.  Anyone with known risk factors for hepatitis C. Sexually Transmitted Diseases (STDs)  You should be  screened each year for STDs including gonorrhea and chlamydia if:  You are sexually active and are younger than 60 years of age.  You are older than 60 years of age and your health care provider tells you that you are at risk for this type of infection.  Your sexual activity has changed since you were last screened and you are at an increased risk for chlamydia or gonorrhea. Ask your health care provider if you are at risk.  Talk with your health care provider about whether you are at high risk of being infected with HIV. Your health care provider may recommend a prescription medicine to help prevent HIV infection. What else can I do?  Schedule regular health, dental, and eye exams.  Stay current with your vaccines (immunizations).  Do not use any tobacco products, such as cigarettes, chewing tobacco, and e-cigarettes. If you need help quitting, ask your health care provider.  Limit alcohol intake to no  more than 2 drinks per day. One drink equals 12 ounces of beer, 5 ounces of wine, or 1 ounces of hard liquor.  Do not use street drugs.  Do not share needles.  Ask your health care provider for help if you need support or information about quitting drugs.  Tell your health care provider if you often feel depressed.  Tell your health care provider if you have ever been abused or do not feel safe at home. This information is not intended to replace advice given to you by your health care provider. Make sure you discuss any questions you have with your health care provider. Document Released: 01/17/2008 Document Revised: 03/19/2016 Document Reviewed: 04/24/2015 Elsevier Interactive Patient Education  2017 [employer]   Cervical Strain and Sprain Rehab Ask your health care provider which exercises are safe for you. Do exercises exactly as told by your health care provider and adjust them as directed. It is normal to feel mild stretching, pulling, tightness, or discomfort as you do  these exercises, but you should stop right away if you feel sudden pain or your pain gets worse.Do not begin these exercises until told by your health care provider. Stretching and range of motion exercises These exercises warm up your muscles and joints and improve the movement and flexibility of your neck. These exercises also help to relieve pain, numbness, and tingling. Exercise A: Cervical side bend   1. Using good posture, sit on a stable chair or stand up. 2. Without moving your shoulders, slowly tilt your left / right ear to your shoulder until you feel a stretch in your neck muscles. You should be looking straight ahead. 3. Hold for __________ seconds. 4. Repeat with the other side of your neck. Repeat __________ times. Complete this exercise __________ times a day. Exercise B: Cervical rotation   1. Using good posture, sit on a stable chair or stand up. 2. Slowly turn your head to the side as if you are looking over your left / right shoulder.  Keep your eyes level with the ground.  Stop when you feel a stretch along the side and the back of your neck. 3. Hold for __________ seconds. 4. Repeat this by turning to your other side. Repeat __________ times. Complete this exercise __________ times a day. Exercise C: Thoracic extension and pectoral stretch  1. Roll a towel or a small blanket so it is about 4 inches (10 cm) in diameter. 2. Lie down on your back on a firm surface. 3. Put the towel lengthwise, under your spine in the middle of your back. It should not be not under your shoulder blades. The towel should line up with your spine from your middle back to your lower back. 4. Put your hands behind your head and let your elbows fall out to your sides. 5. Hold for __________ seconds. Repeat __________ times. Complete this exercise __________ times a day. Strengthening exercises These exercises build strength and endurance in your neck. Endurance is the ability to use your  muscles for a long time, even after your muscles get tired. Exercise D: Upper cervical flexion, isometric  1. Lie on your back with a thin pillow behind your head and a small rolled-up towel under your neck. 2. Gently tuck your chin toward your chest and nod your head down to look toward your feet. Do not lift your head off the pillow. 3. Hold for __________ seconds. 4. Release the tension slowly. Relax your neck muscles completely before  you repeat this exercise. Repeat __________ times. Complete this exercise __________ times a day. Exercise E: Cervical extension, isometric   1. Stand about 6 inches (15 cm) away from a wall, with your back facing the wall. 2. Place a soft object, about 6-8 inches (15-20 cm) in diameter, between the back of your head and the wall. A soft object could be a small pillow, a ball, or a folded towel. 3. Gently tilt your head back and press into the soft object. Keep your jaw and forehead relaxed. 4. Hold for __________ seconds. 5. Release the tension slowly. Relax your neck muscles completely before you repeat this exercise. Repeat __________ times. Complete this exercise __________ times a day. Posture and body mechanics   Body mechanics refers to the movements and positions of your body while you do your daily activities. Posture is part of body mechanics. Good posture and healthy body mechanics can help to relieve stress in your body's tissues and joints. Good posture means that your spine is in its natural S-curve position (your spine is neutral), your shoulders are pulled back slightly, and your head is not tipped forward. The following are general guidelines for applying improved posture and body mechanics to your everyday activities. Standing   When standing, keep your spine neutral and keep your feet about hip-width apart. Keep a slight bend in your knees. Your ears, shoulders, and hips should line up.  When you do a task in which you stand in one place for  a long time, place one foot up on a stable object that is 2-4 inches (5-10 cm) high, such as a footstool. This helps keep your spine neutral. Sitting    When sitting, keep your spine neutral and your keep feet flat on the floor. Use a footrest, if necessary, and keep your thighs parallel to the floor. Avoid rounding your shoulders, and avoid tilting your head forward.  When working at a desk or a computer, keep your desk at a height where your hands are slightly lower than your elbows. Slide your chair under your desk so you are close enough to maintain good posture.  When working at a computer, place your monitor at a height where you are looking straight ahead and you do not have to tilt your head forward or downward to look at the screen. Resting  When lying down and resting, avoid positions that are most painful for you. Try to support your neck in a neutral position. You can use a contour pillow or a small rolled-up towel. Your pillow should support your neck but not push on it. This information is not intended to replace advice given to you by your health care provider. Make sure you discuss any questions you have with your health care provider. Document Released: 07/21/2005 Document Revised: 03/27/2016 Document Reviewed: 06/27/2015 Elsevier Interactive Patient Education  2017 [employer].

## 2016-11-17 NOTE — Assessment & Plan Note (Signed)
Most recent A1c of 6.7 with current dosage of metformin. Obtain hemoglobin A1c and urine microalbumin. Diabetic foot exam completed today. Pneumovax is up-to-date. Maintained on pravastatin for CAD risk reduction. Encouraged to complete diabetic eye exam independently. Continue to monitor blood sugars at home. Continue current dosage of metformin. Follow up pending A1c results.

## 2016-11-17 NOTE — Assessment & Plan Note (Signed)
1) Anticipatory Guidance: Discussed importance of wearing a seatbelt while driving and not texting while driving; changing batteries in smoke detector at least once annually; wearing suntan lotion when outside; eating a balanced and moderate diet; getting physical activity at least 30 minutes per day.  2) Immunizations / Screenings / Labs:  All immunizations are up-to-date per recommendations. Due for dental and vision exam encouraged to be completed independently. Obtain hepatitis C antibody for hepatitis C screening. Patient declines colonoscopy for colon cancer screening with agreement for Cologuard. Obtain PSA for prostate cancer screening. All other screenings are up-to-date per recommendations. Obtain CBC, CMET, and lipid profile.   Overall well exam with risk factors for cardiovascular disease including type 2 diabetes, hyperlipidemia, and previous tobacco use. Chronic conditions managed through medication and lifestyle. He is of good weight. Exercises regularly. Continue other healthy lifestyle behaviors and choices. Follow-up prevention exam in 1 year. Follow-up office visit pending blood work and for chronic conditions.

## 2016-11-24 ENCOUNTER — Other Ambulatory Visit (INDEPENDENT_AMBULATORY_CARE_PROVIDER_SITE_OTHER): Payer: BLUE CROSS/BLUE SHIELD

## 2016-11-24 DIAGNOSIS — E119 Type 2 diabetes mellitus without complications: Secondary | ICD-10-CM

## 2016-11-24 DIAGNOSIS — Z7289 Other problems related to lifestyle: Secondary | ICD-10-CM

## 2016-11-24 DIAGNOSIS — Z0000 Encounter for general adult medical examination without abnormal findings: Secondary | ICD-10-CM

## 2016-11-24 LAB — MICROALBUMIN / CREATININE URINE RATIO
Creatinine,U: 106.4 mg/dL
MICROALB/CREAT RATIO: 0.7 mg/g (ref 0.0–30.0)
Microalb, Ur: <0.7 mg/dL (ref 0.0–1.9)

## 2016-11-24 LAB — COMPREHENSIVE METABOLIC PANEL
ALT: 30 U/L (ref 0–53)
AST: 32 U/L (ref 0–37)
Albumin: 4.3 g/dL (ref 3.5–5.2)
Alkaline Phosphatase: 54 U/L (ref 39–117)
BUN: 10 mg/dL (ref 6–23)
CHLORIDE: 105 meq/L (ref 96–112)
CO2: 29 meq/L (ref 19–32)
Calcium: 9.5 mg/dL (ref 8.4–10.5)
Creatinine, Ser: 0.88 mg/dL (ref 0.40–1.50)
GFR: 113.74 mL/min (ref >60.00)
GLUCOSE: 108 mg/dL — AB (ref 70–99)
POTASSIUM: 4.5 meq/L (ref 3.5–5.1)
SODIUM: 140 meq/L (ref 135–145)
TOTAL PROTEIN: 7 g/dL (ref 6.0–8.3)
Total Bilirubin: 0.5 mg/dL (ref 0.2–1.2)

## 2016-11-24 LAB — CBC
HCT: 39.6 % (ref 39.0–52.0)
Hemoglobin: 13.3 g/dL (ref 13.0–17.0)
MCHC: 33.6 g/dL (ref 30.0–36.0)
MCV: 92.6 fl (ref 78.0–100.0)
Platelets: 305 10*3/uL (ref 150.0–400.0)
RBC: 4.28 Mil/uL (ref 4.22–5.81)
RDW: 12.7 % (ref 11.5–15.5)
WBC: 4.7 10*3/uL (ref 4.0–10.5)

## 2016-11-24 LAB — LIPID PANEL
Cholesterol: 162 mg/dL (ref 0–200)
HDL: 60 mg/dL (ref >39.00)
LDL CALC: 75 mg/dL (ref 0–99)
NONHDL: 102.11
Total CHOL/HDL Ratio: 3
Triglycerides: 136 mg/dL (ref 0.0–149.0)
VLDL: 27.2 mg/dL (ref 0.0–40.0)

## 2016-11-24 LAB — HEMOGLOBIN A1C: Hgb A1c MFr Bld: 7.1 % — ABNORMAL HIGH (ref 4.6–6.5)

## 2016-11-24 LAB — PSA: PSA: 0.45 ng/mL (ref 0.10–4.00)

## 2016-11-25 LAB — HEPATITIS C ANTIBODY: HCV Ab: NEGATIVE

## 2016-11-27 ENCOUNTER — Telehealth: Payer: Self-pay | Admitting: Family

## 2016-11-27 NOTE — Telephone Encounter (Signed)
Pt aware of results 

## 2016-11-27 NOTE — Telephone Encounter (Signed)
Pt called back for results for work done on 4/23.

## 2016-12-12 ENCOUNTER — Other Ambulatory Visit: Payer: Self-pay | Admitting: Family

## 2016-12-18 ENCOUNTER — Other Ambulatory Visit: Payer: Self-pay | Admitting: Family

## 2017-01-17 ENCOUNTER — Other Ambulatory Visit: Payer: Self-pay | Admitting: Family

## 2017-04-06 ENCOUNTER — Other Ambulatory Visit: Payer: Self-pay | Admitting: Family

## 2017-07-03 ENCOUNTER — Other Ambulatory Visit: Payer: Self-pay | Admitting: Family

## 2017-07-13 ENCOUNTER — Ambulatory Visit: Payer: BLUE CROSS/BLUE SHIELD | Admitting: Family Medicine

## 2017-07-15 ENCOUNTER — Ambulatory Visit: Payer: BLUE CROSS/BLUE SHIELD | Admitting: Family Medicine

## 2017-07-22 ENCOUNTER — Ambulatory Visit: Payer: BLUE CROSS/BLUE SHIELD | Admitting: Family Medicine

## 2017-07-22 ENCOUNTER — Telehealth: Payer: Self-pay | Admitting: Emergency Medicine

## 2017-07-22 NOTE — Telephone Encounter (Signed)
Left a VM for patient to give the office a call back regarding what time patient is suppose to come in today for new patient appointment.

## 2017-07-29 ENCOUNTER — Ambulatory Visit: Payer: BLUE CROSS/BLUE SHIELD | Admitting: Family Medicine

## 2017-07-29 ENCOUNTER — Encounter: Payer: Self-pay | Admitting: Family Medicine

## 2017-07-29 VITALS — BP 118/78 | HR 54 | Temp 98.4°F | Ht 67.0 in | Wt 166.0 lb

## 2017-07-29 DIAGNOSIS — E785 Hyperlipidemia, unspecified: Secondary | ICD-10-CM

## 2017-07-29 DIAGNOSIS — E119 Type 2 diabetes mellitus without complications: Secondary | ICD-10-CM | POA: Diagnosis not present

## 2017-07-29 MED ORDER — METFORMIN HCL 500 MG PO TABS: 500.0000 mg | ORAL_TABLET | Freq: Two times a day (BID) | ORAL | 3 refills | Status: DC

## 2017-07-29 MED ORDER — PRAVASTATIN SODIUM 20 MG PO TABS: ORAL_TABLET | ORAL | 3 refills | Status: DC

## 2017-07-29 NOTE — Progress Notes (Signed)
   Subjective:    Patient ID: Vita ErmWillie Joseph Zammit, male    DOB: 28-Oct-1955, 61 y.o.   MRN: [id number]  HPI Here [facility] He feels well in general. We reviewed his past medical and surgical histories today together. He has diabetes and dyslipidemia. He takes Metformin and watches his diet. He works out at a gym 4 days a week. His last A1c in April was 7.1. His BP is stable. He had a wellness exam in April. He has never had a colonoscopy. It was arranged for him to have a Cologuard test a few years ago but he never sent in a sample.    Review of Systems  Constitutional: Negative.   Respiratory: Negative.   Cardiovascular: Negative.   Neurological: Negative.        Objective:   Physical Exam  Constitutional: He is oriented to person, place, and time. He appears well-developed and well-nourished.  Neck: No thyromegaly present.  Cardiovascular: Normal rate, regular rhythm, normal heart sounds and intact distal pulses.  Pulmonary/Chest: Effort normal and breath sounds normal. No respiratory distress. He has no wheezes. He has no rales.  Lymphadenopathy:    He has no cervical adenopathy.  Neurological: He is alert and oriented to person, place, and time.          Assessment & Plan:  Intro visit for this patient. His lipid panel was excellent in April. We will get another A1c today. Set up a colonoscopy soon.  Gershon CraneStephen Chania Kochanski, MD

## 2017-07-30 LAB — HEMOGLOBIN A1C: HEMOGLOBIN A1C: 7.3 % — AB (ref 4.6–6.5)

## 2017-08-11 ENCOUNTER — Other Ambulatory Visit: Payer: Self-pay

## 2017-08-11 MED ORDER — METFORMIN HCL 1000 MG PO TABS: 1000.0000 mg | ORAL_TABLET | Freq: Two times a day (BID) | ORAL | 3 refills | Status: DC

## 2017-08-11 NOTE — Telephone Encounter (Signed)
Increase Metformin to 1000 mg bid. Call in #[phone] with 3 rf. Recheck an A1c in 90 days. Rx sent.

## 2017-09-03 ENCOUNTER — Telehealth: Payer: Self-pay | Admitting: Family

## 2017-09-03 NOTE — Telephone Encounter (Signed)
Copied from CRM [phone]. Topic: General - Other >> Sep 03, 2017  1:18 PM Oneal GroutSebastian, Jennifer S wrote: Reason for CRM: Would like to speak to nurse regarding dose change in metformin

## 2017-09-03 NOTE — Telephone Encounter (Signed)
Routed to wrong department, please route accordingly  °

## 2017-09-04 NOTE — Telephone Encounter (Signed)
Pt has two Rx's on fill both have been filled one is for1000 MG and 500 MG what dose should he be on?

## 2017-09-08 NOTE — Telephone Encounter (Signed)
We recently increased this to 1000 mg bid

## 2017-09-08 NOTE — Telephone Encounter (Signed)
Called and spoke with pt. Pt advised and voiced understanding.  

## 2018-03-29 ENCOUNTER — Encounter: Payer: Self-pay | Admitting: Gastroenterology

## 2018-03-29 ENCOUNTER — Ambulatory Visit (INDEPENDENT_AMBULATORY_CARE_PROVIDER_SITE_OTHER): Payer: BLUE CROSS/BLUE SHIELD | Admitting: Family Medicine

## 2018-03-29 ENCOUNTER — Encounter: Payer: Self-pay | Admitting: Family Medicine

## 2018-03-29 VITALS — BP 116/80 | HR 56 | Temp 98.2°F | Ht 67.0 in | Wt 153.4 lb

## 2018-03-29 DIAGNOSIS — Z0000 Encounter for general adult medical examination without abnormal findings: Secondary | ICD-10-CM | POA: Diagnosis not present

## 2018-03-29 DIAGNOSIS — E119 Type 2 diabetes mellitus without complications: Secondary | ICD-10-CM | POA: Diagnosis not present

## 2018-03-29 MED ORDER — NAPROXEN 500 MG PO TABS: 500.0000 mg | ORAL_TABLET | Freq: Two times a day (BID) | ORAL | 3 refills | Status: DC

## 2018-03-29 MED ORDER — PRAVASTATIN SODIUM 20 MG PO TABS: ORAL_TABLET | ORAL | 3 refills | Status: DC

## 2018-03-29 MED ORDER — CLOBETASOL PROPIONATE 0.05 % EX CREA: 1.0000 "application " | TOPICAL_CREAM | Freq: Two times a day (BID) | CUTANEOUS | 3 refills | Status: DC

## 2018-03-29 NOTE — Progress Notes (Signed)
   Subjective:    Patient ID: Daniel Jones, male    DOB: October 31, 1955, 62 y.o.   MRN: [id number]  HPI Here for a well exam. He feels well.    Review of Systems  Constitutional: Negative.   HENT: Negative.   Eyes: Negative.   Respiratory: Negative.   Cardiovascular: Negative.   Gastrointestinal: Negative.   Genitourinary: Negative.   Musculoskeletal: Negative.   Skin: Negative.   Neurological: Negative.   Psychiatric/Behavioral: Negative.        Objective:   Physical Exam  Constitutional: He is oriented to person, place, and time. He appears well-developed and well-nourished. No distress.  HENT:  Head: Normocephalic and atraumatic.  Right Ear: External ear normal.  Left Ear: External ear normal.  Nose: Nose normal.  Mouth/Throat: Oropharynx is clear and moist. No oropharyngeal exudate.  Eyes: Pupils are equal, round, and reactive to light. Conjunctivae and EOM are normal. Right eye exhibits no discharge. Left eye exhibits no discharge. No scleral icterus.  Neck: Neck supple. No JVD present. No tracheal deviation present. No thyromegaly present.  Cardiovascular: Normal rate, regular rhythm, normal heart sounds and intact distal pulses. Exam reveals no gallop and no friction rub.  No murmur heard. Pulmonary/Chest: Effort normal and breath sounds normal. No respiratory distress. He has no wheezes. He has no rales. He exhibits no tenderness.  Abdominal: Soft. Bowel sounds are normal. He exhibits no distension and no mass. There is no tenderness. There is no rebound and no guarding.  Genitourinary: Rectum normal, prostate normal and penis normal. Rectal exam shows guaiac negative stool. No penile tenderness.  Musculoskeletal: Normal range of motion. He exhibits no edema or tenderness.  Lymphadenopathy:    He has no cervical adenopathy.  Neurological: He is alert and oriented to person, place, and time. He has normal reflexes. He displays normal reflexes. No cranial nerve  deficit. He exhibits normal muscle tone. Coordination normal.  Skin: Skin is warm and dry. No rash noted. He is not diaphoretic. No erythema. No pallor.  Psychiatric: He has a normal mood and affect. His behavior is normal. Judgment and thought content normal.          Assessment & Plan:  Well exam. We discussed diet and exercise. Set up fasting labs. Set up his first colonoscopy.  Gershon CraneStephen Anea Fodera, MD

## 2018-04-12 ENCOUNTER — Other Ambulatory Visit: Payer: BLUE CROSS/BLUE SHIELD

## 2018-04-13 ENCOUNTER — Other Ambulatory Visit: Payer: BLUE CROSS/BLUE SHIELD

## 2018-04-14 ENCOUNTER — Other Ambulatory Visit (INDEPENDENT_AMBULATORY_CARE_PROVIDER_SITE_OTHER): Payer: BLUE CROSS/BLUE SHIELD

## 2018-04-14 DIAGNOSIS — Z0000 Encounter for general adult medical examination without abnormal findings: Secondary | ICD-10-CM

## 2018-04-14 DIAGNOSIS — E119 Type 2 diabetes mellitus without complications: Secondary | ICD-10-CM | POA: Diagnosis not present

## 2018-04-15 LAB — HEMOGLOBIN A1C: Hgb A1c MFr Bld: 6.5 % (ref 4.6–6.5)

## 2018-04-15 LAB — POC URINALSYSI DIPSTICK (AUTOMATED)
Bilirubin, UA: NEGATIVE
Blood, UA: NEGATIVE
Glucose, UA: NEGATIVE
Ketones, UA: NEGATIVE
Leukocytes, UA: NEGATIVE
NITRITE UA: NEGATIVE
Protein, UA: NEGATIVE
SPEC GRAV UA: 1.015 (ref 1.010–1.025)
UROBILINOGEN UA: 0.2 U/dL
pH, UA: 7 (ref 5.0–8.0)

## 2018-04-15 LAB — BASIC METABOLIC PANEL
BUN: 8 mg/dL (ref 6–23)
CALCIUM: 9.2 mg/dL (ref 8.4–10.5)
CO2: 28 meq/L (ref 19–32)
Chloride: 104 mEq/L (ref 96–112)
Creatinine, Ser: 0.93 mg/dL (ref 0.40–1.50)
GFR: 106.21 mL/min (ref >60.00)
Glucose, Bld: 80 mg/dL (ref 70–99)
Potassium: 4.5 mEq/L (ref 3.5–5.1)
SODIUM: 138 meq/L (ref 135–145)

## 2018-04-15 LAB — LIPID PANEL
Cholesterol: 135 mg/dL (ref 0–200)
HDL: 60.8 mg/dL (ref >39.00)
LDL Cholesterol: 64 mg/dL (ref 0–99)
NONHDL: 73.87
Total CHOL/HDL Ratio: 2
Triglycerides: 51 mg/dL (ref 0.0–149.0)
VLDL: 10.2 mg/dL (ref 0.0–40.0)

## 2018-04-15 LAB — PSA: PSA: 0.45 ng/mL (ref 0.10–4.00)

## 2018-04-15 LAB — CBC WITH DIFFERENTIAL/PLATELET
BASOS ABS: 0.1 10*3/uL (ref 0.0–0.1)
BASOS PCT: 2.3 % (ref 0.0–3.0)
EOS ABS: 0.3 10*3/uL (ref 0.0–0.7)
Eosinophils Relative: 6.8 % — ABNORMAL HIGH (ref 0.0–5.0)
HEMATOCRIT: 35.5 % — AB (ref 39.0–52.0)
Hemoglobin: 12.1 g/dL — ABNORMAL LOW (ref 13.0–17.0)
LYMPHS PCT: 45.7 % (ref 12.0–46.0)
Lymphs Abs: 2 10*3/uL (ref 0.7–4.0)
MCHC: 34.2 g/dL (ref 30.0–36.0)
MCV: 92.8 fl (ref 78.0–100.0)
MONO ABS: 0.4 10*3/uL (ref 0.1–1.0)
Monocytes Relative: 9 % (ref 3.0–12.0)
Neutro Abs: 1.6 10*3/uL (ref 1.4–7.7)
Neutrophils Relative %: 36.2 % — ABNORMAL LOW (ref 43.0–77.0)
Platelets: 306 10*3/uL (ref 150.0–400.0)
RBC: 3.83 Mil/uL — ABNORMAL LOW (ref 4.22–5.81)
RDW: 12.8 % (ref 11.5–15.5)
WBC: 4.5 10*3/uL (ref 4.0–10.5)

## 2018-04-15 LAB — HEPATIC FUNCTION PANEL
ALBUMIN: 4.2 g/dL (ref 3.5–5.2)
ALK PHOS: 45 U/L (ref 39–117)
ALT: 17 U/L (ref 0–53)
AST: 20 U/L (ref 0–37)
Bilirubin, Direct: 0.1 mg/dL (ref 0.0–0.3)
TOTAL PROTEIN: 6.8 g/dL (ref 6.0–8.3)
Total Bilirubin: 0.7 mg/dL (ref 0.2–1.2)

## 2018-04-15 LAB — TSH: TSH: 1.14 u[IU]/mL (ref 0.35–4.50)

## 2018-05-10 ENCOUNTER — Ambulatory Visit (AMBULATORY_SURGERY_CENTER): Payer: Self-pay

## 2018-05-10 VITALS — Ht 66.0 in | Wt 155.6 lb

## 2018-05-10 DIAGNOSIS — Z1211 Encounter for screening for malignant neoplasm of colon: Secondary | ICD-10-CM

## 2018-05-10 NOTE — Progress Notes (Signed)
No egg or soy allergy known to patient  No issues with past sedation with any surgeries  or procedures, no intubation problems  No diet pills per patient No home 02 use per patient  No blood thinners per patient  Pt denies issues with constipation  No A fib or A flutter  EMMI video sent to pt's e mail  Pt. declined 

## 2018-05-11 ENCOUNTER — Encounter: Payer: Self-pay | Admitting: Gastroenterology

## 2018-05-24 ENCOUNTER — Encounter: Payer: Self-pay | Admitting: Gastroenterology

## 2018-05-24 ENCOUNTER — Ambulatory Visit: Payer: BLUE CROSS/BLUE SHIELD | Admitting: Gastroenterology

## 2018-05-24 VITALS — BP 129/79 | HR 55 | Temp 97.8°F | Resp 14 | Ht 66.0 in | Wt 155.0 lb

## 2018-05-24 DIAGNOSIS — Z1211 Encounter for screening for malignant neoplasm of colon: Secondary | ICD-10-CM

## 2018-05-24 HISTORY — PX: COLONOSCOPY: SHX174

## 2018-05-24 MED ORDER — SODIUM CHLORIDE 0.9 % IV SOLN: 500.0000 mL | Freq: Once | INTRAVENOUS | Status: AC

## 2018-05-24 NOTE — Progress Notes (Signed)
Pt's states no medical or surgical changes since previsit or office visit. 

## 2018-05-24 NOTE — Op Note (Signed)
[facility] Patient Name: Daniel Jones Procedure Date: 05/24/2018 8:36 AM MRN: [id number] Endoscopist: Tressia Danas MD, MD Age: 62 Referring MD:  Date of Birth: 1956/08/03 Gender: Male Account #: [id number] Procedure:                Colonoscopy Indications:              Screening for colorectal malignant neoplasm. Is no                            known family history of colon cancer or polyps. No                            baseline GI symptoms. Medicines:                See the Anesthesia note for documentation of the                            administered medications Procedure:                Pre-Anesthesia Assessment:                           - Prior to the procedure, a History and Physical                            was performed, and patient medications and                            allergies were reviewed. The patient's tolerance of                            previous anesthesia was also reviewed. The risks                            and benefits of the procedure and the sedation                            options and risks were discussed with the patient.                            All questions were answered, and informed consent                            was obtained. Prior Anticoagulants: The patient has                            taken no previous anticoagulant or antiplatelet                            agents. ASA Grade Assessment: II - A patient with                            mild systemic disease. After reviewing the risks  and benefits, the patient was deemed in                            satisfactory condition to undergo the procedure.                           After obtaining informed consent, the colonoscope                            was passed under direct vision. Throughout the                            procedure, the patient's blood pressure, pulse, and                            oxygen saturations were monitored  continuously. The                            Colonoscope was introduced through the anus and                            advanced to the the cecum, identified by                            appendiceal orifice and ileocecal valve. The                            colonoscopy was performed without difficulty. The                            patient tolerated the procedure well. The quality                            of the bowel preparation was good. Scope In: 8:43:28 AM Scope Out: 8:56:52 AM Scope Withdrawal Time: 0 hours 8 minutes 58 seconds  Total Procedure Duration: 0 hours 13 minutes 24 seconds  Findings:                 The perianal and digital rectal examinations were                            normal.                           Multiple small and large-mouthed diverticula were                            found in the sigmoid colon and descending colon.                           The exam was otherwise without abnormality on                            direct and retroflexion views. Complications:            No immediate  complications. Estimated Blood Loss:     Estimated blood loss: none. Impression:               - Diverticulosis in the sigmoid colon and in the                            descending colon.                           - The examination was otherwise normal on direct                            and retroflexion views.                           - No specimens collected. Recommendation:           - Discharge patient to home.                           - Resume regular diet. Follow high-fiber diet                           - Continue present medications.                           - Repeat colonoscopy in 10 years for screening                            purposes based on current colon cancer screening                            guidelines. However colonoscopy should be performed                            earlier with the onset of any new GI symptoms. Tressia Danas MD,  MD 05/24/2018 9:01:34 AM This report has been signed electronically.

## 2018-05-24 NOTE — Patient Instructions (Signed)
YOU HAD AN ENDOSCOPIC PROCEDURE TODAY AT THE [facility]:   Refer to the procedure report that was given to you for any specific questions about what was found during the examination.  If the procedure report does not answer your questions, please call your gastroenterologist to clarify.  If you requested that your care partner not be given the details of your procedure findings, then the procedure report has been included in a sealed envelope for you to review at your convenience later.  YOU SHOULD EXPECT: Some feelings of bloating in the abdomen. Passage of more gas than usual.  Walking can help get rid of the air that was put into your GI tract during the procedure and reduce the bloating. If you had a lower endoscopy (such as a colonoscopy or flexible sigmoidoscopy) you may notice spotting of blood in your stool or on the toilet paper. If you underwent a bowel prep for your procedure, you may not have a normal bowel movement for a few days.  Please Note:  You might notice some irritation and congestion in your nose or some drainage.  This is from the oxygen used during your procedure.  There is no need for concern and it should clear up in a day or so.  SYMPTOMS TO REPORT IMMEDIATELY:   Following lower endoscopy (colonoscopy or flexible sigmoidoscopy):  Excessive amounts of blood in the stool  Significant tenderness or worsening of abdominal pains  Swelling of the abdomen that is new, acute  Fever of 100F or higher  For urgent or emergent issues, a gastroenterologist can be reached at any hour by calling [phone].   DIET:  We do recommend a small meal at first, but then you may proceed to your regular diet.  Drink plenty of fluids but you should avoid alcoholic beverages for 24 hours.  MEDICATIONS: Continue present medications.  Please see handouts given to you by your recovery nurse.  ACTIVITY:  You should plan to take it easy for the rest of today and you should NOT  DRIVE or use heavy machinery until tomorrow (because of the sedation medicines used during the test).    FOLLOW UP: Our staff will call the number listed on your records the next business day following your procedure to check on you and address any questions or concerns that you may have regarding the information given to you following your procedure. If we do not reach you, we will leave a message.  However, if you are feeling well and you are not experiencing any problems, there is no need to return our call.  We will assume that you have returned to your regular daily activities without incident.  If any biopsies were taken you will be contacted by phone or by letter within the next 1-3 weeks.  Please call us at [phone] if you have not heard about the biopsies in 3 weeks.   Thank you for allowing us to provide for your healthcare needs today.   SIGNATURES/CONFIDENTIALITY: You and/or your care partner have signed paperwork which will be entered into your electronic medical record.  These signatures attest to the fact that that the information above on your After Visit Summary has been reviewed and is understood.  Full responsibility of the confidentiality of this discharge information lies with you and/or your care-partner. 

## 2018-05-24 NOTE — Progress Notes (Signed)
Report to PACU, RN, vss, BBS= Clear.  

## 2018-05-25 ENCOUNTER — Telehealth: Payer: Self-pay

## 2018-05-25 ENCOUNTER — Telehealth: Payer: Self-pay | Admitting: *Deleted

## 2018-05-25 NOTE — Telephone Encounter (Signed)
First follow up call attempt. Left message on voicemail that had phone number identifier.

## 2018-05-25 NOTE — Telephone Encounter (Signed)
Second follow up phone call attempt.  No answer, message left for pt. 

## 2018-07-21 ENCOUNTER — Other Ambulatory Visit: Payer: Self-pay | Admitting: Family Medicine

## 2018-12-28 ENCOUNTER — Other Ambulatory Visit: Payer: Self-pay | Admitting: Family Medicine

## 2019-01-05 ENCOUNTER — Telehealth: Payer: Self-pay | Admitting: *Deleted

## 2019-01-05 NOTE — Telephone Encounter (Signed)
Copied from CRM [id number]. Topic: Appointment Scheduling - Scheduling Inquiry for Clinic >> Jan 04, 2019  4:39 PM Dalphine Handing A wrote: Patient is requesting an appointment for physical and would like a callback  Clinic RN spoke with patient. Informed patient last physical was 03/2018 so it will have to be scheduled for 03/2019 patient verbalized understanding and reports he needs an appointment for DM f/u. Patient scheduled for 01/11/2019 for in office visit for DM/fu. Patient informed someone will call on  Monday to complete COVID-19 Pre-screen. Patient verbalized understanding.

## 2019-01-11 ENCOUNTER — Other Ambulatory Visit: Payer: Self-pay

## 2019-01-11 ENCOUNTER — Ambulatory Visit: Payer: BC Managed Care – PPO | Admitting: Family Medicine

## 2019-01-11 ENCOUNTER — Encounter: Payer: Self-pay | Admitting: Family Medicine

## 2019-01-11 VITALS — BP 110/78 | HR 58 | Temp 97.3°F | Wt 156.1 lb

## 2019-01-11 DIAGNOSIS — E119 Type 2 diabetes mellitus without complications: Secondary | ICD-10-CM

## 2019-01-11 DIAGNOSIS — M542 Cervicalgia: Secondary | ICD-10-CM | POA: Diagnosis not present

## 2019-01-11 LAB — HEMOGLOBIN A1C: Hgb A1c MFr Bld: 6.9 % — ABNORMAL HIGH (ref 4.6–6.5)

## 2019-01-11 NOTE — Progress Notes (Signed)
   Subjective:    Patient ID: Daniel Jones, male    DOB: 03-Jun-1956, 63 y.o.   MRN: [id number]  HPI Here to follow up on diabetes and to discuss intermittent stiffness and pain in the neck. He normally works out at [employer] regularly, but he has had to stop this due to the virus pandemic. The pain is mild and does not affect his activities. His am fasting glucoses have averaged 100-120.    Review of Systems  Constitutional: Negative.   Respiratory: Negative.   Cardiovascular: Negative.   Musculoskeletal: Positive for neck pain and neck stiffness.       Objective:   Physical Exam Constitutional:      Appearance: Normal appearance.  Neck:     Musculoskeletal: Normal range of motion and neck supple. No neck rigidity or muscular tenderness.  Cardiovascular:     Rate and Rhythm: Normal rate and regular rhythm.     Pulses: Normal pulses.     Heart sounds: Normal heart sounds.  Pulmonary:     Effort: Pulmonary effort is normal.     Breath sounds: Normal breath sounds.  Neurological:     Mental Status: He is alert.           Assessment & Plan:  His diabetes seems to be well controlled, but we will send him for an A1c today. He seems to have some mild arthritis in the cervical spine. He can use Ibuprofen as needed. Alysia Penna, MD

## 2019-03-19 ENCOUNTER — Other Ambulatory Visit: Payer: Self-pay | Admitting: Family Medicine

## 2019-10-05 ENCOUNTER — Other Ambulatory Visit: Payer: Self-pay | Admitting: Family Medicine

## 2020-02-01 DIAGNOSIS — H6121 Impacted cerumen, right ear: Secondary | ICD-10-CM | POA: Diagnosis not present

## 2020-02-18 ENCOUNTER — Other Ambulatory Visit: Payer: Self-pay | Admitting: Family Medicine

## 2020-05-24 ENCOUNTER — Other Ambulatory Visit: Payer: Self-pay | Admitting: Family Medicine

## 2020-05-24 NOTE — Telephone Encounter (Signed)
Patient called, left VM to return the call to the office to schedule a physical and labs. Last CPE 04/14/2018.

## 2020-06-19 ENCOUNTER — Other Ambulatory Visit: Payer: Self-pay | Admitting: Family Medicine

## 2020-08-21 ENCOUNTER — Other Ambulatory Visit: Payer: Self-pay | Admitting: Family Medicine

## 2020-08-23 ENCOUNTER — Other Ambulatory Visit: Payer: Self-pay | Admitting: Family Medicine

## 2020-08-23 NOTE — Telephone Encounter (Signed)
Last office visit-01/10/2019 Last refill--05/24/2020----60 tabs no refills  Can this patient receive a refill?

## 2020-08-29 ENCOUNTER — Ambulatory Visit: Payer: BC Managed Care – PPO | Admitting: Family Medicine

## 2020-09-03 ENCOUNTER — Ambulatory Visit: Payer: BC Managed Care – PPO | Admitting: Family Medicine

## 2020-09-04 ENCOUNTER — Encounter: Payer: Self-pay | Admitting: Family Medicine

## 2020-09-04 ENCOUNTER — Other Ambulatory Visit: Payer: Self-pay

## 2020-09-04 ENCOUNTER — Ambulatory Visit: Payer: BC Managed Care – PPO | Admitting: Family Medicine

## 2020-09-04 VITALS — BP 122/64 | HR 55 | Temp 98.5°F | Resp 18 | Ht 67.0 in | Wt 149.6 lb

## 2020-09-04 DIAGNOSIS — E785 Hyperlipidemia, unspecified: Secondary | ICD-10-CM | POA: Diagnosis not present

## 2020-09-04 DIAGNOSIS — B029 Zoster without complications: Secondary | ICD-10-CM | POA: Diagnosis not present

## 2020-09-04 DIAGNOSIS — E119 Type 2 diabetes mellitus without complications: Secondary | ICD-10-CM

## 2020-09-04 LAB — POCT GLYCOSYLATED HEMOGLOBIN (HGB A1C): Hemoglobin A1C: 7 % — AB (ref 4.0–5.6)

## 2020-09-04 MED ORDER — METFORMIN HCL 1000 MG PO TABS: 1000.0000 mg | ORAL_TABLET | Freq: Two times a day (BID) | ORAL | 3 refills | Status: DC

## 2020-09-04 MED ORDER — VALACYCLOVIR HCL 1 G PO TABS: 1000.0000 mg | ORAL_TABLET | Freq: Three times a day (TID) | ORAL | 0 refills | Status: DC

## 2020-09-04 MED ORDER — PRAVASTATIN SODIUM 20 MG PO TABS: ORAL_TABLET | ORAL | 3 refills | Status: DC

## 2020-09-04 NOTE — Progress Notes (Signed)
   Subjective:    Patient ID: Daniel Jones, male    DOB: 09-27-1955, 65 y.o.   MRN: [id number]  HPI Here to follow up on diabetes and for medication refills. He also mentions a patch of itchy burning skin on the posterior neck that started about 4 days ago. He thinks he had an insect bite there. No fever. His A1c today is 7.0.  Review of Systems  Constitutional: Negative.   Respiratory: Negative.   Cardiovascular: Negative.   Skin: Positive for rash.       Objective:   Physical Exam Constitutional:      Appearance: Normal appearance.  Cardiovascular:     Rate and Rhythm: Normal rate and regular rhythm.     Pulses: Normal pulses.     Heart sounds: Normal heart sounds.  Pulmonary:     Effort: Pulmonary effort is normal.     Breath sounds: Normal breath sounds.  Skin:    Comments: There is a patch of excoriated red vesicles on the right posterior neck and right upper back   Neurological:     Mental Status: He is alert.           Assessment & Plan:  He has shingles, and we will treat this with 10 days of Valtrex. His diabetes is stable. We will refill medications. He will schedule a complete exam soon with fasting labs.  Gershon Crane, MD

## 2020-09-04 NOTE — Addendum Note (Signed)
Addended by: CREFT, Melton Alar L on: 09/04/2020 10:06 AM   Modules accepted: Orders

## 2020-09-17 ENCOUNTER — Ambulatory Visit (INDEPENDENT_AMBULATORY_CARE_PROVIDER_SITE_OTHER): Payer: BC Managed Care – PPO | Admitting: Family Medicine

## 2020-09-17 ENCOUNTER — Other Ambulatory Visit: Payer: Self-pay

## 2020-09-17 ENCOUNTER — Telehealth: Payer: Self-pay | Admitting: Family Medicine

## 2020-09-17 ENCOUNTER — Encounter: Payer: Self-pay | Admitting: Family Medicine

## 2020-09-17 VITALS — BP 120/78 | HR 47 | Temp 98.3°F | Ht 67.25 in | Wt 148.4 lb

## 2020-09-17 DIAGNOSIS — Z125 Encounter for screening for malignant neoplasm of prostate: Secondary | ICD-10-CM

## 2020-09-17 DIAGNOSIS — Z0000 Encounter for general adult medical examination without abnormal findings: Secondary | ICD-10-CM | POA: Diagnosis not present

## 2020-09-17 LAB — BASIC METABOLIC PANEL
BUN: 10 mg/dL (ref 6–23)
CO2: 27 mEq/L (ref 19–32)
Calcium: 9.8 mg/dL (ref 8.4–10.5)
Chloride: 108 mEq/L (ref 96–112)
Creatinine, Ser: 0.87 mg/dL (ref 0.40–1.50)
GFR: 91.85 mL/min (ref >60.00)
Glucose, Bld: 103 mg/dL — ABNORMAL HIGH (ref 70–99)
Potassium: 4.8 mEq/L (ref 3.5–5.1)
Sodium: 142 mEq/L (ref 135–145)

## 2020-09-17 LAB — HEPATIC FUNCTION PANEL
ALT: 12 U/L (ref 0–53)
AST: 20 U/L (ref 0–37)
Albumin: 4.2 g/dL (ref 3.5–5.2)
Alkaline Phosphatase: 57 U/L (ref 39–117)
Bilirubin, Direct: 0.1 mg/dL (ref 0.0–0.3)
Total Bilirubin: 0.4 mg/dL (ref 0.2–1.2)
Total Protein: 7.3 g/dL (ref 6.0–8.3)

## 2020-09-17 LAB — LIPID PANEL
Cholesterol: 166 mg/dL (ref 0–200)
HDL: 69.6 mg/dL (ref >39.00)
LDL Cholesterol: 79 mg/dL (ref 0–99)
NonHDL: 96.79
Total CHOL/HDL Ratio: 2
Triglycerides: 89 mg/dL (ref 0.0–149.0)
VLDL: 17.8 mg/dL (ref 0.0–40.0)

## 2020-09-17 LAB — CBC WITH DIFFERENTIAL/PLATELET
Basophils Absolute: 0.1 10*3/uL (ref 0.0–0.1)
Basophils Relative: 1 % (ref 0.0–3.0)
Eosinophils Absolute: 0.2 10*3/uL (ref 0.0–0.7)
Eosinophils Relative: 3.4 % (ref 0.0–5.0)
HCT: 38.7 % — ABNORMAL LOW (ref 39.0–52.0)
Hemoglobin: 13.1 g/dL (ref 13.0–17.0)
Lymphocytes Relative: 25.3 % (ref 12.0–46.0)
Lymphs Abs: 1.4 10*3/uL (ref 0.7–4.0)
MCHC: 34 g/dL (ref 30.0–36.0)
MCV: 99.7 fl (ref 78.0–100.0)
Monocytes Absolute: 0.4 10*3/uL (ref 0.1–1.0)
Monocytes Relative: 6.9 % (ref 3.0–12.0)
Neutro Abs: 3.6 10*3/uL (ref 1.4–7.7)
Neutrophils Relative %: 63.4 % (ref 43.0–77.0)
Platelets: 340 10*3/uL (ref 150.0–400.0)
RBC: 3.88 Mil/uL — ABNORMAL LOW (ref 4.22–5.81)
RDW: 13.3 % (ref 11.5–15.5)
WBC: 5.6 10*3/uL (ref 4.0–10.5)

## 2020-09-17 LAB — PSA: PSA: 0.8 ng/mL (ref 0.10–4.00)

## 2020-09-17 LAB — TSH: TSH: 1.16 u[IU]/mL (ref 0.35–4.50)

## 2020-09-17 NOTE — Telephone Encounter (Signed)
Pt is calling with the dates of his COVID vaccines  1st dose  10/27/2019   2nd dose. 11/17/2019  Booster 05/28/2020.

## 2020-09-17 NOTE — Progress Notes (Signed)
Subjective:    Patient ID: Kenric Ginger, male    DOB: 1956-07-15, 65 y.o.   MRN: [id number]  HPI Here for a well exam. He feels fine. We recently treated him for shingles on the neck, and these feel much better. Now they only itch a little.    Review of Systems  Constitutional: Negative.   HENT: Negative.   Eyes: Negative.   Respiratory: Negative.   Cardiovascular: Negative.   Gastrointestinal: Negative.   Genitourinary: Negative.   Musculoskeletal: Negative.   Skin: Negative.   Neurological: Negative.   Psychiatric/Behavioral: Negative.        Objective:   Physical Exam Constitutional:      General: He is not in acute distress.    Appearance: He is well-developed and well-nourished. He is not diaphoretic.  HENT:     Head: Normocephalic and atraumatic.     Right Ear: External ear normal.     Left Ear: External ear normal.     Nose: Nose normal.     Mouth/Throat:     Mouth: Oropharynx is clear and moist.     Pharynx: No oropharyngeal exudate.  Eyes:     General: No scleral icterus.       Right eye: No discharge.        Left eye: No discharge.     Extraocular Movements: EOM normal.     Conjunctiva/sclera: Conjunctivae normal.     Pupils: Pupils are equal, round, and reactive to light.  Neck:     Thyroid: No thyromegaly.     Vascular: No JVD.     Trachea: No tracheal deviation.  Cardiovascular:     Rate and Rhythm: Normal rate and regular rhythm.     Pulses: Intact distal pulses.     Heart sounds: Normal heart sounds. No murmur heard. No friction rub. No gallop.   Pulmonary:     Effort: Pulmonary effort is normal. No respiratory distress.     Breath sounds: Normal breath sounds. No wheezing or rales.  Chest:     Chest wall: No tenderness.  Abdominal:     General: Bowel sounds are normal. There is no distension.     Palpations: Abdomen is soft. There is no mass.     Tenderness: There is no abdominal tenderness. There is no guarding or rebound.   Genitourinary:    Penis: Normal. No tenderness.      Prostate: Normal.     Rectum: Normal. Guaiac result negative.  Musculoskeletal:        General: No tenderness or edema. Normal range of motion.     Cervical back: Neck supple.  Lymphadenopathy:     Cervical: No cervical adenopathy.  Skin:    General: Skin is warm and dry.     Coloration: Skin is not pale.     Findings: No erythema or rash.     Comments: Right posterior neck has a few dried up vesicles   Neurological:     Mental Status: He is alert and oriented to person, place, and time.     Cranial Nerves: No cranial nerve deficit.     Motor: No abnormal muscle tone.     Coordination: Coordination normal.     Deep Tendon Reflexes: Reflexes are normal and symmetric. Reflexes normal.  Psychiatric:        Mood and Affect: Mood and affect normal.        Behavior: Behavior normal.        Thought Content: Thought  content normal.        Judgment: Judgment normal.           Assessment & Plan:  Well exam. We discussed diet and exercise. Get fasting labs.  Gershon Crane, MD

## 2020-09-17 NOTE — Telephone Encounter (Signed)
Recorded in his immunization record. Dm/cma

## 2020-12-24 ENCOUNTER — Other Ambulatory Visit: Payer: Self-pay

## 2020-12-25 ENCOUNTER — Ambulatory Visit: Payer: BC Managed Care – PPO | Admitting: Family Medicine

## 2020-12-25 ENCOUNTER — Encounter: Payer: Self-pay | Admitting: Family Medicine

## 2020-12-25 VITALS — BP 132/80 | HR 50 | Temp 98.0°F | Wt 144.4 lb

## 2020-12-25 DIAGNOSIS — J019 Acute sinusitis, unspecified: Secondary | ICD-10-CM

## 2020-12-25 MED ORDER — AZITHROMYCIN 250 MG PO TABS: ORAL_TABLET | ORAL | 0 refills | Status: DC

## 2020-12-25 NOTE — Progress Notes (Signed)
   Subjective:    Patient ID: Daniel Jones, male    DOB: 11/15/1955, 65 y.o.   MRN: [id number]  HPI Here for 2 weeks of stuffy head, PND, and a dry cough. No fever. No SOB or body aches. No NVD. He is drinking fluids and taking Theraflu. He tested negative for the Covid virus last weekend.    Review of Systems  Constitutional: Positive for fatigue.  HENT: Positive for congestion, postnasal drip and sinus pressure. Negative for sore throat.   Eyes: Negative.   Respiratory: Positive for cough. Negative for shortness of breath and wheezing.   Cardiovascular: Negative.   Gastrointestinal: Negative.        Objective:   Physical Exam Constitutional:      Appearance: Normal appearance. He is not ill-appearing.  HENT:     Right Ear: Tympanic membrane, ear canal and external ear normal.     Left Ear: Tympanic membrane, ear canal and external ear normal.     Nose: Nose normal.     Mouth/Throat:     Pharynx: Oropharynx is clear.  Eyes:     Conjunctiva/sclera: Conjunctivae normal.  Cardiovascular:     Rate and Rhythm: Normal rate and regular rhythm.     Pulses: Normal pulses.     Heart sounds: Normal heart sounds.  Pulmonary:     Effort: Pulmonary effort is normal.     Breath sounds: Normal breath sounds.  Lymphadenopathy:     Cervical: No cervical adenopathy.  Neurological:     Mental Status: He is alert.           Assessment & Plan:  Sinusitis, treat with a Zpack. He is written out of work from 12-24-20 trough 12-26-20.  Gershon Crane, MD

## 2021-05-31 ENCOUNTER — Ambulatory Visit: Payer: BC Managed Care – PPO | Admitting: Family Medicine

## 2021-06-03 ENCOUNTER — Ambulatory Visit: Payer: BC Managed Care – PPO | Admitting: Family Medicine

## 2021-06-03 ENCOUNTER — Other Ambulatory Visit: Payer: Self-pay

## 2021-06-03 VITALS — BP 142/80 | HR 60 | Temp 98.1°F | Wt 158.7 lb

## 2021-06-03 DIAGNOSIS — Z23 Encounter for immunization: Secondary | ICD-10-CM | POA: Diagnosis not present

## 2021-06-03 DIAGNOSIS — R809 Proteinuria, unspecified: Secondary | ICD-10-CM | POA: Diagnosis not present

## 2021-06-03 DIAGNOSIS — R600 Localized edema: Secondary | ICD-10-CM | POA: Diagnosis not present

## 2021-06-03 MED ORDER — FUROSEMIDE 40 MG PO TABS: 40.0000 mg | ORAL_TABLET | Freq: Every day | ORAL | 0 refills | Status: DC

## 2021-06-03 NOTE — Progress Notes (Signed)
Established Patient Office Visit  Subjective:  Patient ID: Daniel Jones, male    DOB: 1956-01-31  Age: 65 y.o. MRN: [id number]  CC:  Chief Complaint  Patient presents with   Edema    Feet and ankles off and on for 5 weeks,     HPI Daniel Jones presents for approximately 5-week history of bilateral foot, ankle, leg swelling.  Somewhat progressive.  No history of similar problem.  No recent change in medications.  He takes metformin and pravastatin.  No regular nonsteroidal use.  No calcium channel blockers.  Denies any history of Jones failure.  No recent dyspnea or orthopnea.  He thinks the edema is slightly worse late in the day.  He works on his feet most of the day.  Tries to avoid regular sodium intake.  Weight today is currently up from 148 when he had his last physical but it looks like his weight is fluctuated generally around 155 in the past. No dysuria.  History of normal renal function.  He does have type 2 diabetes treated with metformin and hyperlipidemia treated with pravastatin.  No history of Jones failure  Past Medical History:  Diagnosis Date   Allergic rhinitis due to pollen    Allergy    Diabetes mellitus without complication (HCC)    GERD (gastroesophageal reflux disease)    History of sexually transmitted disease    Hyperlipidemia     Past Surgical History:  Procedure Laterality Date   CIRCUMCISION     1991   COLONOSCOPY  05/24/2018   per Dr. Orvan Falconer, no polyps, diverticulosis, repeat in 10 yrs     Family History  Problem Relation Age of Onset   Cancer Father    Diabetes Sister    Gout Sister    Jones disease Sister    Diabetes Sister    Hypertension Sister    Hyperlipidemia Sister    Stomach cancer Neg Hx    Rectal cancer Neg Hx    Colon cancer Neg Hx    Colon polyps Neg Hx     Social History   Socioeconomic History   Marital status: Single    Spouse name: Not on file   Number of children: 0   Years of education: 13    Highest education level: Not on file  Occupational History   Occupation: Route driver  Tobacco Use   Smoking status: Former    Packs/day: 0.25    Years: 30.00    Pack years: 7.50    Types: Cigarettes    Quit date: 03/12/2011    Years since quitting: 10.2   Smokeless tobacco: Never  Vaping Use   Vaping Use: Never used  Substance and Sexual Activity   Alcohol use: No   Drug use: No   Sexual activity: Yes    Partners: Female  Other Topics Concern   Not on file  Social History Narrative   HSG. [employer] - 2 years - diploma. Married '94- divorced '97. No children.  Lives alone. 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 OFFICE VISIT (PHYSICAL) NEEDED FOR ADDITIONAL REFILLS 180 tablet 3   naproxen (NAPROSYN) 500 MG tablet Take 1 tablet (500 mg total) by mouth 2 (two) times daily with a meal. 180 tablet 3   pravastatin (PRAVACHOL) 20 MG tablet TAKE 1 TABLET BY MOUTH EVERY DAY 90 tablet 3   valACYclovir (VALTREX) 1000 MG tablet Take 1 tablet (1,000 mg total) by mouth 3 (three) times daily. 30 tablet 0   azithromycin (ZITHROMAX Z-PAK) 250 MG tablet As directed 6 each 0   Facility-Administered Medications Prior to Visit  Medication Dose Route Frequency Provider Last Rate Last Admin   0.9 %  sodium chloride infusion  500 mL Intravenous Once Tressia Danas, MD        No Known Allergies  ROS Review of Systems  Constitutional:  Negative for appetite change and  fever.  Respiratory:  Negative for cough and shortness of breath.   Cardiovascular:  Positive for leg swelling. Negative for chest pain and palpitations.  Gastrointestinal:  Negative for abdominal pain.  Genitourinary:  Negative for difficulty urinating and dysuria.  Neurological:  Negative for dizziness.     Objective:    Physical Exam Vitals reviewed.  Constitutional:      Appearance: Normal appearance.  Cardiovascular:     Rate and Rhythm: Normal rate and regular rhythm.  Pulmonary:     Effort: Pulmonary effort is normal.     Breath sounds: Normal breath sounds. No wheezing or rales.  Musculoskeletal:     Right lower leg: Edema present.     Left lower leg: Edema present.     Comments: He has 2+ pitting edema feet, ankles, legs bilaterally.  Both feet are warm to touch with good distal pulses and good capillary refill.  Neurological:     Mental Status: He is alert.    BP (!) 142/80 (BP Location: Left Arm, Patient Position: Sitting, Cuff Size: Normal)   Pulse 60   Temp 98.1 F (36.7 C) (Oral)   Wt 158 lb 11.2 oz (72 kg)   SpO2 99%   BMI 24.67 kg/m  Wt Readings from Last 3 Encounters:  06/03/21 158 lb 11.2 oz (72 kg)  12/25/20 144 lb 6.4 oz (65.5 kg)  09/17/20 148 lb 6.4 oz (67.3 kg)     Health Maintenance Due  Topic Date Due   OPHTHALMOLOGY EXAM  Never done   HIV Screening  Never done   Zoster Vaccines- Shingrix (1 of 2) Never done   Pneumococcal Vaccine 46-22 Years old (2 - PCV) 09/23/2016   FOOT EXAM  09/23/2016   URINE MICROALBUMIN  11/24/2017   COVID-19 Vaccine (4 - Booster for Pfizer series) 07/23/2020   TETANUS/TDAP  03/02/2021   HEMOGLOBIN A1C  03/04/2021    There are no preventive care reminders to display for this patient.  Lab Results  Component Value Date   TSH 1.16 09/17/2020   Lab Results  Component Value Date   WBC 5.6 09/17/2020   HGB 13.1 09/17/2020   HCT 38.7 (L) 09/17/2020   MCV 99.7 09/17/2020   PLT 340.0 09/17/2020   Lab  Results  Component Value Date   NA 142 09/17/2020   K 4.8 09/17/2020   CO2 27 09/17/2020   GLUCOSE 103 (H) 09/17/2020   BUN 10 09/17/2020   CREATININE 0.87 09/17/2020   BILITOT 0.4 09/17/2020  ALKPHOS 57 09/17/2020   AST 20 09/17/2020   ALT 12 09/17/2020   PROT 7.3 09/17/2020   ALBUMIN 4.2 09/17/2020   CALCIUM 9.8 09/17/2020   GFR 91.85 09/17/2020   Lab Results  Component Value Date   CHOL 166 09/17/2020   Lab Results  Component Value Date   HDL 69.60 09/17/2020   Lab Results  Component Value Date   LDLCALC 79 09/17/2020   Lab Results  Component Value Date   TRIG 89.0 09/17/2020   Lab Results  Component Value Date   CHOLHDL 2 09/17/2020   Lab Results  Component Value Date   HGBA1C 7.0 (A) 09/04/2020      Assessment & Plan:   Problem List Items Addressed This Visit   None Visit Diagnoses     Bilateral leg edema    -  Primary   Relevant Orders   CBC with Differential/Platelet   CMP   TSH   Brain Natriuretic Peptide   Microalbumin / creatinine urine ratio   Need for immunization against influenza       Relevant Orders   Flu Vaccine QUAD 20mo+IM (Fluarix, Fluzone & Alfiuria Quad PF) (Completed)     Patient presents with approximately 5-week history of progressive bilateral leg edema and some recent modest weight gain.  No dyspnea.  Etiology unclear at this time.  Needs further evaluation. -Recommend start with labs with CBC, CMP, TSH, BNP, urine microalbumin screen -We wrote for some limited furosemide 40 mg once daily to start until further evaluated -Depending on labs above consider echocardiogram  Meds ordered this encounter  Medications   furosemide (LASIX) 40 MG tablet    Sig: Take 1 tablet (40 mg total) by mouth daily.    Dispense:  30 tablet    Refill:  0    Follow-up: No follow-ups on file.    Evelena Peat, MD

## 2021-06-03 NOTE — Patient Instructions (Signed)
Set up labs for tomorrow or Wednesday if possible

## 2021-06-04 ENCOUNTER — Other Ambulatory Visit (INDEPENDENT_AMBULATORY_CARE_PROVIDER_SITE_OTHER): Payer: BC Managed Care – PPO

## 2021-06-04 DIAGNOSIS — R600 Localized edema: Secondary | ICD-10-CM | POA: Diagnosis not present

## 2021-06-04 LAB — CBC WITH DIFFERENTIAL/PLATELET
Basophils Absolute: 0.1 10*3/uL (ref 0.0–0.1)
Basophils Relative: 1.5 % (ref 0.0–3.0)
Eosinophils Absolute: 0.4 10*3/uL (ref 0.0–0.7)
Eosinophils Relative: 6.2 % — ABNORMAL HIGH (ref 0.0–5.0)
HCT: 41.1 % (ref 39.0–52.0)
Hemoglobin: 13.8 g/dL (ref 13.0–17.0)
Lymphocytes Relative: 28.6 % (ref 12.0–46.0)
Lymphs Abs: 1.6 10*3/uL (ref 0.7–4.0)
MCHC: 33.5 g/dL (ref 30.0–36.0)
MCV: 98.8 fl (ref 78.0–100.0)
Monocytes Absolute: 0.5 10*3/uL (ref 0.1–1.0)
Monocytes Relative: 9.4 % (ref 3.0–12.0)
Neutro Abs: 3.1 10*3/uL (ref 1.4–7.7)
Neutrophils Relative %: 54.3 % (ref 43.0–77.0)
Platelets: 301 10*3/uL (ref 150.0–400.0)
RBC: 4.16 Mil/uL — ABNORMAL LOW (ref 4.22–5.81)
RDW: 13.4 % (ref 11.5–15.5)
WBC: 5.7 10*3/uL (ref 4.0–10.5)

## 2021-06-04 LAB — COMPREHENSIVE METABOLIC PANEL
ALT: 22 U/L (ref 0–53)
AST: 35 U/L (ref 0–37)
Albumin: 3.5 g/dL (ref 3.5–5.2)
Alkaline Phosphatase: 63 U/L (ref 39–117)
BUN: 11 mg/dL (ref 6–23)
CO2: 26 mEq/L (ref 19–32)
Calcium: 8.7 mg/dL (ref 8.4–10.5)
Chloride: 107 mEq/L (ref 96–112)
Creatinine, Ser: 0.82 mg/dL (ref 0.40–1.50)
GFR: 93.04 mL/min (ref >60.00)
Glucose, Bld: 99 mg/dL (ref 70–99)
Potassium: 3.7 mEq/L (ref 3.5–5.1)
Sodium: 141 mEq/L (ref 135–145)
Total Bilirubin: 0.7 mg/dL (ref 0.2–1.2)
Total Protein: 6.6 g/dL (ref 6.0–8.3)

## 2021-06-04 LAB — MICROALBUMIN / CREATININE URINE RATIO
Creatinine,U: 152.2 mg/dL
Microalb Creat Ratio: 225.4 mg/g — ABNORMAL HIGH (ref 0.0–30.0)
Microalb, Ur: 343.1 mg/dL — ABNORMAL HIGH (ref 0.0–1.9)

## 2021-06-04 LAB — BRAIN NATRIURETIC PEPTIDE: Pro B Natriuretic peptide (BNP): 50 pg/mL (ref 0.0–100.0)

## 2021-06-04 LAB — TSH: TSH: 2.38 u[IU]/mL (ref 0.35–5.50)

## 2021-06-04 NOTE — Addendum Note (Signed)
Addended by: Kristian Covey on: 06/04/2021 09:35 PM   Modules accepted: Orders

## 2021-06-05 ENCOUNTER — Other Ambulatory Visit: Payer: Self-pay

## 2021-06-05 ENCOUNTER — Other Ambulatory Visit (INDEPENDENT_AMBULATORY_CARE_PROVIDER_SITE_OTHER): Payer: BC Managed Care – PPO

## 2021-06-05 ENCOUNTER — Telehealth: Payer: Self-pay

## 2021-06-05 DIAGNOSIS — R809 Proteinuria, unspecified: Secondary | ICD-10-CM | POA: Diagnosis not present

## 2021-06-05 LAB — URINALYSIS, ROUTINE W REFLEX MICROSCOPIC
Bilirubin Urine: NEGATIVE
Ketones, ur: NEGATIVE
Leukocytes,Ua: NEGATIVE
Nitrite: NEGATIVE
Specific Gravity, Urine: 1.025 (ref 1.000–1.030)
Total Protein, Urine: >=300 — AB
Urine Glucose: NEGATIVE
Urobilinogen, UA: 0.2 (ref 0.0–1.0)
pH: 6 (ref 5.0–8.0)

## 2021-06-05 LAB — C-REACTIVE PROTEIN: CRP: 1.2 mg/dL (ref 0.5–20.0)

## 2021-06-05 NOTE — Telephone Encounter (Signed)
Lab results currently available.   Patient had received a call earlier for lab results message was left for patient to call back.      Called patient again lvm to call back for results.

## 2021-06-05 NOTE — Telephone Encounter (Signed)
Patient called requesting a call back when lab results are ready.

## 2021-06-07 ENCOUNTER — Other Ambulatory Visit: Payer: BC Managed Care – PPO

## 2021-06-07 ENCOUNTER — Other Ambulatory Visit: Payer: Self-pay

## 2021-06-07 DIAGNOSIS — R809 Proteinuria, unspecified: Secondary | ICD-10-CM | POA: Diagnosis not present

## 2021-06-10 ENCOUNTER — Other Ambulatory Visit: Payer: Self-pay

## 2021-06-10 DIAGNOSIS — R809 Proteinuria, unspecified: Secondary | ICD-10-CM

## 2021-06-10 LAB — ANTI-NUCLEAR AB-TITER (ANA TITER): ANA Titer 1: 1:80 {titer} — ABNORMAL HIGH

## 2021-06-10 LAB — HEPATITIS C ANTIBODY
Hepatitis C Ab: NONREACTIVE
SIGNAL TO CUT-OFF: 0.13 (ref <1.00)

## 2021-06-10 LAB — ANA: Anti Nuclear Antibody (ANA): POSITIVE — AB

## 2021-06-10 LAB — HEPATITIS B SURFACE ANTIGEN: Hepatitis B Surface Ag: NONREACTIVE

## 2021-06-10 LAB — RPR: RPR Ser Ql: NONREACTIVE

## 2021-06-10 LAB — ANCA SCREEN W REFLEX TITER: ANCA Screen: NEGATIVE

## 2021-06-10 LAB — CRYOGLOBULIN

## 2021-06-11 LAB — PE AND FLC, SERUM
A/G Ratio: 1 (ref 0.7–1.7)
Albumin ELP: 3.1 g/dL (ref 2.9–4.4)
Alpha 1: 0.3 g/dL (ref 0.0–0.4)
Alpha 2: 0.7 g/dL (ref 0.4–1.0)
Beta: 1.1 g/dL (ref 0.7–1.3)
Gamma Globulin: 0.9 g/dL (ref 0.4–1.8)
Globulin, Total: 3 g/dL (ref 2.2–3.9)
Ig Kappa Free Light Chain: 31 mg/L — ABNORMAL HIGH (ref 3.3–19.4)
Ig Lambda Free Light Chain: 22.2 mg/L (ref 5.7–26.3)
KAPPA/LAMBDA RATIO: 1.4 (ref 0.26–1.65)
Total Protein: 6.1 g/dL (ref 6.0–8.5)

## 2021-06-11 LAB — UPEP/UIFE/LIGHT CHAINS/TP, 24-HR UR
% BETA, Urine: 8.6 %
ALBUMIN, U: 80.8 %
ALPHA 1 URINE: 6.1 %
ALPHA-2-GLOBULIN, U: 2.8 %
Free Kappa Lt Chains,Ur: 25.56 mg/L (ref 1.17–86.46)
Free Lambda Lt Chains,Ur: 12.29 mg/L (ref 0.27–15.21)
GAMMA GLOBULIN URINE: 1.7 %
Kappa/Lambda Ratio,U: 2.08 (ref 1.83–14.26)
Protein, 24H Urine: 3371 mg/24 hr — ABNORMAL HIGH (ref 30–150)
Protein, Ur: 177.4 mg/dL

## 2021-06-14 LAB — MULTIPLE MYELOMA PANEL, SERUM
Albumin SerPl Elph-Mcnc: 3.2 g/dL (ref 2.9–4.4)
Albumin/Glob SerPl: 1.1 (ref 0.7–1.7)
Alpha 1: 0.3 g/dL (ref 0.0–0.4)
Alpha2 Glob SerPl Elph-Mcnc: 0.5 g/dL (ref 0.4–1.0)
B-Globulin SerPl Elph-Mcnc: 1.3 g/dL (ref 0.7–1.3)
Gamma Glob SerPl Elph-Mcnc: 1 g/dL (ref 0.4–1.8)
Globulin, Total: 3.1 g/dL (ref 2.2–3.9)
IgA/Immunoglobulin A, Serum: 333 mg/dL (ref 61–437)
IgG (Immunoglobin G), Serum: 1043 mg/dL (ref 603–1613)
IgM (Immunoglobulin M), Srm: 81 mg/dL (ref 20–172)
Total Protein: 6.3 g/dL (ref 6.0–8.5)

## 2021-06-14 LAB — SPECIMEN STATUS REPORT

## 2021-06-24 ENCOUNTER — Encounter: Payer: Self-pay | Admitting: Family Medicine

## 2021-06-24 DIAGNOSIS — E1129 Type 2 diabetes mellitus with other diabetic kidney complication: Secondary | ICD-10-CM | POA: Diagnosis not present

## 2021-06-24 DIAGNOSIS — R809 Proteinuria, unspecified: Secondary | ICD-10-CM | POA: Diagnosis not present

## 2021-06-24 DIAGNOSIS — E785 Hyperlipidemia, unspecified: Secondary | ICD-10-CM | POA: Diagnosis not present

## 2021-06-24 DIAGNOSIS — R768 Other specified abnormal immunological findings in serum: Secondary | ICD-10-CM | POA: Diagnosis not present

## 2021-06-24 DIAGNOSIS — E1122 Type 2 diabetes mellitus with diabetic chronic kidney disease: Secondary | ICD-10-CM | POA: Diagnosis not present

## 2021-06-26 ENCOUNTER — Other Ambulatory Visit: Payer: Self-pay | Admitting: Family Medicine

## 2021-06-26 ENCOUNTER — Other Ambulatory Visit: Payer: Self-pay | Admitting: Nephrology

## 2021-06-26 DIAGNOSIS — R768 Other specified abnormal immunological findings in serum: Secondary | ICD-10-CM

## 2021-06-26 DIAGNOSIS — R600 Localized edema: Secondary | ICD-10-CM

## 2021-06-26 DIAGNOSIS — E785 Hyperlipidemia, unspecified: Secondary | ICD-10-CM

## 2021-06-26 DIAGNOSIS — R809 Proteinuria, unspecified: Secondary | ICD-10-CM

## 2021-06-26 DIAGNOSIS — E1129 Type 2 diabetes mellitus with other diabetic kidney complication: Secondary | ICD-10-CM

## 2021-07-10 ENCOUNTER — Other Ambulatory Visit (HOSPITAL_COMMUNITY): Payer: Self-pay | Admitting: Nephrology

## 2021-07-10 ENCOUNTER — Ambulatory Visit
Admission: RE | Admit: 2021-07-10 | Discharge: 2021-07-10 | Disposition: A | Discharge status: Home / Self Care | Payer: BC Managed Care – PPO | Source: Ambulatory Visit | Attending: Nephrology | Admitting: Nephrology

## 2021-07-10 DIAGNOSIS — R600 Localized edema: Secondary | ICD-10-CM

## 2021-07-10 DIAGNOSIS — R768 Other specified abnormal immunological findings in serum: Secondary | ICD-10-CM

## 2021-07-10 DIAGNOSIS — E1129 Type 2 diabetes mellitus with other diabetic kidney complication: Secondary | ICD-10-CM

## 2021-07-10 DIAGNOSIS — E785 Hyperlipidemia, unspecified: Secondary | ICD-10-CM

## 2021-07-10 DIAGNOSIS — R809 Proteinuria, unspecified: Secondary | ICD-10-CM

## 2021-07-20 ENCOUNTER — Other Ambulatory Visit: Payer: Self-pay | Admitting: Family Medicine

## 2021-07-23 ENCOUNTER — Other Ambulatory Visit: Payer: Self-pay | Admitting: Student

## 2021-07-24 ENCOUNTER — Other Ambulatory Visit: Payer: Self-pay

## 2021-07-24 ENCOUNTER — Encounter (HOSPITAL_COMMUNITY): Payer: Self-pay

## 2021-07-24 ENCOUNTER — Ambulatory Visit (HOSPITAL_COMMUNITY)
Admission: RE | Admit: 2021-07-24 | Discharge: 2021-07-24 | Disposition: A | Discharge status: Home / Self Care | Payer: BC Managed Care – PPO | Source: Ambulatory Visit | Attending: Nephrology | Admitting: Nephrology

## 2021-07-24 DIAGNOSIS — R809 Proteinuria, unspecified: Secondary | ICD-10-CM | POA: Diagnosis not present

## 2021-07-24 LAB — GLUCOSE, CAPILLARY: Glucose-Capillary: 106 mg/dL — ABNORMAL HIGH (ref 70–99)

## 2021-07-24 MED ORDER — SODIUM CHLORIDE 0.9 % IV SOLN: INTRAVENOUS | Status: DC

## 2021-07-24 NOTE — Progress Notes (Signed)
Client states drove himself here and has no one to drive him home or stay with him tonight; Jennifer,PA notified and per Victorino Dike client advised scheduler will call him to reschedule

## 2021-07-26 ENCOUNTER — Ambulatory Visit (INDEPENDENT_AMBULATORY_CARE_PROVIDER_SITE_OTHER): Payer: BC Managed Care – PPO | Admitting: Family Medicine

## 2021-07-26 ENCOUNTER — Encounter: Payer: Self-pay | Admitting: Family Medicine

## 2021-07-26 VITALS — BP 152/78 | HR 59 | Temp 98.9°F | Wt 166.5 lb

## 2021-07-26 DIAGNOSIS — I10 Essential (primary) hypertension: Secondary | ICD-10-CM

## 2021-07-26 DIAGNOSIS — R809 Proteinuria, unspecified: Secondary | ICD-10-CM

## 2021-07-26 DIAGNOSIS — E119 Type 2 diabetes mellitus without complications: Secondary | ICD-10-CM

## 2021-07-26 NOTE — Progress Notes (Signed)
° °  Subjective:    Patient ID: Daniel Jones, male    DOB: [birth year], 65 y.o.   MRN: [id number]  HPI Here to follow up on proteinuria. He saw Dr. Caryl Never on 06-03-21 for swelling in both lower legs. He denies any pain or SOB. This started about 2 months before he came in. He was given 40 mg of Lasix daily, but he says this has not helped. He continues to swell, and the pill does not make him urinate. Labs that day showed significant protein in the urine. His renal function is normal, with a creatinine of 0.82 ad a GFR of 93. No signs of CHF. He was tested for hepatitis, lupus, etc but these have not been revealing. His diabetes and HTN have been fairly well controlled (the A1c in February was 7.0). He was referred to Nephrology and he saw Dr. Ronalee Belts on 06-05-21. A 24 hour urine showed 3371 grams of protein. He was scheduled for a renal biopsy last week, but he had to cancel due to lack of transportation.    Review of Systems  Constitutional: Negative.   Respiratory: Negative.    Cardiovascular:  Positive for leg swelling. Negative for chest pain and palpitations.  Gastrointestinal: Negative.   Genitourinary: Negative.       Objective:   Physical Exam Constitutional:      Appearance: Normal appearance. He is not ill-appearing.  Cardiovascular:     Rate and Rhythm: Normal rate and regular rhythm.     Pulses: Normal pulses.     Heart sounds: Normal heart sounds.  Pulmonary:     Effort: Pulmonary effort is normal.     Breath sounds: Normal breath sounds.  Musculoskeletal:     Comments: 3+ edema in both lower legs   Lymphadenopathy:     Cervical: No cervical adenopathy.  Neurological:     Mental Status: He is alert.          Assessment & Plan:  Proteinuria of uncertain etiology. I urged him to contact the Nephrology office to reschedule the renal biopsy asap. His HTN and diabetes are stable. He will stay on Lasix 40 mg daily for the swelling. I also suggested elevating  the legs when possible and wearing compression stockings. We spent a total of (35   ) minutes reviewing records and discussing these issues.  Gershon Crane, MD

## 2021-08-02 LAB — MULTIPLE MYELOMA PANEL, SERUM
Albumin SerPl Elph-Mcnc: 2.8 g/dL — ABNORMAL LOW (ref 2.9–4.4)
Albumin/Glob SerPl: 1.1 (ref 0.7–1.7)
Alpha 1: 0.3 g/dL (ref 0.0–0.4)
Alpha2 Glob SerPl Elph-Mcnc: 0.7 g/dL (ref 0.4–1.0)
B-Globulin SerPl Elph-Mcnc: 0.8 g/dL (ref 0.7–1.3)
Gamma Glob SerPl Elph-Mcnc: 0.8 g/dL (ref 0.4–1.8)
Globulin, Total: 2.6 g/dL (ref 2.2–3.9)
IgG (Immunoglobin G), Serum: 884 mg/dL (ref 603–1613)
IgM (Immunoglobulin M), Srm: 85 mg/dL (ref 20–172)
Immunoglobulin A, (IgA) QN, Serum: 295 mg/dL (ref 61–437)
Total Protein: 5.4 g/dL — ABNORMAL LOW (ref 6.0–8.5)

## 2021-08-13 ENCOUNTER — Encounter: Payer: Self-pay | Admitting: Family Medicine

## 2021-08-13 ENCOUNTER — Other Ambulatory Visit: Payer: Self-pay | Admitting: Family Medicine

## 2021-08-13 DIAGNOSIS — R600 Localized edema: Secondary | ICD-10-CM | POA: Insufficient documentation

## 2021-08-15 DIAGNOSIS — R600 Localized edema: Secondary | ICD-10-CM | POA: Diagnosis not present

## 2021-08-15 DIAGNOSIS — R809 Proteinuria, unspecified: Secondary | ICD-10-CM | POA: Diagnosis not present

## 2021-08-15 DIAGNOSIS — E785 Hyperlipidemia, unspecified: Secondary | ICD-10-CM | POA: Diagnosis not present

## 2021-08-15 DIAGNOSIS — E1129 Type 2 diabetes mellitus with other diabetic kidney complication: Secondary | ICD-10-CM | POA: Diagnosis not present

## 2021-08-19 ENCOUNTER — Other Ambulatory Visit: Payer: Self-pay | Admitting: Student

## 2021-08-20 ENCOUNTER — Other Ambulatory Visit: Payer: Self-pay | Admitting: Radiology

## 2021-08-20 ENCOUNTER — Ambulatory Visit (HOSPITAL_COMMUNITY)
Admission: RE | Admit: 2021-08-20 | Discharge: 2021-08-20 | Disposition: A | Discharge status: Home / Self Care | Payer: BC Managed Care – PPO | Source: Ambulatory Visit | Attending: Nephrology | Admitting: Nephrology

## 2021-08-20 ENCOUNTER — Other Ambulatory Visit: Payer: Self-pay

## 2021-08-20 ENCOUNTER — Encounter (HOSPITAL_COMMUNITY): Payer: Self-pay

## 2021-08-20 DIAGNOSIS — I10 Essential (primary) hypertension: Secondary | ICD-10-CM | POA: Insufficient documentation

## 2021-08-20 DIAGNOSIS — R809 Proteinuria, unspecified: Secondary | ICD-10-CM | POA: Diagnosis not present

## 2021-08-20 DIAGNOSIS — Z8249 Family history of ischemic heart disease and other diseases of the circulatory system: Secondary | ICD-10-CM | POA: Insufficient documentation

## 2021-08-20 DIAGNOSIS — N059 Unspecified nephritic syndrome with unspecified morphologic changes: Secondary | ICD-10-CM | POA: Diagnosis not present

## 2021-08-20 DIAGNOSIS — Z833 Family history of diabetes mellitus: Secondary | ICD-10-CM | POA: Insufficient documentation

## 2021-08-20 DIAGNOSIS — M7989 Other specified soft tissue disorders: Secondary | ICD-10-CM | POA: Insufficient documentation

## 2021-08-20 DIAGNOSIS — E119 Type 2 diabetes mellitus without complications: Secondary | ICD-10-CM | POA: Diagnosis not present

## 2021-08-20 LAB — CBC
HCT: 32.7 % — ABNORMAL LOW (ref 39.0–52.0)
Hemoglobin: 11.5 g/dL — ABNORMAL LOW (ref 13.0–17.0)
MCH: 33.9 pg (ref 26.0–34.0)
MCHC: 35.2 g/dL (ref 30.0–36.0)
MCV: 96.5 fL (ref 80.0–100.0)
Platelets: 478 10*3/uL — ABNORMAL HIGH (ref 150–400)
RBC: 3.39 MIL/uL — ABNORMAL LOW (ref 4.22–5.81)
RDW: 12.1 % (ref 11.5–15.5)
WBC: 3.9 10*3/uL — ABNORMAL LOW (ref 4.0–10.5)
nRBC: 0 % (ref 0.0–0.2)

## 2021-08-20 LAB — GLUCOSE, CAPILLARY
Glucose-Capillary: 83 mg/dL (ref 70–99)
Glucose-Capillary: 80 mg/dL (ref 70–99)

## 2021-08-20 LAB — PROTIME-INR
INR: 0.8 (ref 0.8–1.2)
Prothrombin Time: 11 seconds — ABNORMAL LOW (ref 11.4–15.2)

## 2021-08-20 MED ORDER — MIDAZOLAM HCL 2 MG/2ML IJ SOLN
INTRAMUSCULAR | Status: AC
  Filled 2021-08-20: qty 4

## 2021-08-20 MED ORDER — GELATIN ABSORBABLE 12-7 MM EX MISC
CUTANEOUS | Status: AC
  Filled 2021-08-20: qty 1

## 2021-08-20 MED ORDER — FENTANYL CITRATE (PF) 100 MCG/2ML IJ SOLN
INTRAMUSCULAR | Status: AC | PRN
  Administered 2021-08-20: 50 ug via INTRAVENOUS
  Administered 2021-08-20: 25 ug via INTRAVENOUS

## 2021-08-20 MED ORDER — FENTANYL CITRATE (PF) 100 MCG/2ML IJ SOLN
INTRAMUSCULAR | Status: AC
  Filled 2021-08-20: qty 2

## 2021-08-20 MED ORDER — MIDAZOLAM HCL 2 MG/2ML IJ SOLN
INTRAMUSCULAR | Status: AC | PRN
  Administered 2021-08-20 (×3): 1 mg via INTRAVENOUS

## 2021-08-20 MED ORDER — LIDOCAINE HCL (PF) 1 % IJ SOLN
INTRAMUSCULAR | Status: AC
  Filled 2021-08-20: qty 30

## 2021-08-20 MED ORDER — SODIUM CHLORIDE 0.9 % IV SOLN: INTRAVENOUS | Status: DC

## 2021-08-20 NOTE — Procedures (Addendum)
Interventional Radiology Procedure:   Indications: Proteinuria  Procedure: US guided right renal biopsy  Findings: 2 cores from right kidney lower pole  Complications: No immediate complications noted.     EBL: Minimal  Plan: Strict bedrest 4 hours   Elianny Buxbaum R. Anselm Pancoast, MD  Pager: [phone]

## 2021-08-20 NOTE — H&P (Addendum)
Chief Complaint: Patient was seen in consultation today for random renal biopsy at the request of Rosita Fire  Referring Physician(s): Rosita Fire  Supervising Physician: Markus Daft  Patient Status: [facility]  History of Present Illness: Daniel Jones is a 66 y.o. male   Hx DM; HTN Noted legs and feet swelling Sept 2022 Labs revealed proteinuria Decrease in Urine output  PMD referred to Dr Carolin Sicks Scheduled now for random renal biopsy   Past Medical History:  Diagnosis Date   Allergic rhinitis due to pollen    Allergy    Diabetes mellitus without complication ([place])    GERD (gastroesophageal reflux disease)    History of sexually transmitted disease    Hyperlipidemia     Past Surgical History:  Procedure Laterality Date   CIRCUMCISION     1991   COLONOSCOPY  05/24/2018   per Dr. Tarri Glenn, no polyps, diverticulosis, repeat in 10 yrs     Allergies: Patient has no known allergies.  Medications: Prior to Admission medications   Medication Sig Start Date End Date Taking? Authorizing Provider  Ascorbic Acid (VITAMIN C PO) Take 1 tablet by mouth daily. With vitamin d   Yes [provider]  clobetasol cream (TEMOVATE) 0.05 % APPLY TO AFFECTED AREA TWICE A DAY Patient taking differently: Apply 1 application topically 2 (two) times daily as needed (irritation). 08/21/20  Yes Laurey Morale, MD  furosemide (LASIX) 40 MG tablet TAKE 1 TABLET BY MOUTH EVERY DAY 08/13/21  Yes Burchette, Alinda Sierras, MD  lisinopril (ZESTRIL) 10 MG tablet Take 10 mg by mouth daily.   Yes [provider]  metFORMIN (GLUCOPHAGE) 1000 MG tablet Take 1 tablet (1,000 mg total) by mouth 2 (two) times daily with a meal. OFFICE VISIT (PHYSICAL) NEEDED FOR ADDITIONAL REFILLS 09/04/20  Yes Laurey Morale, MD  pravastatin (PRAVACHOL) 20 MG tablet TAKE 1 TABLET BY MOUTH EVERY DAY 09/04/20  Yes Laurey Morale, MD  SUPER B COMPLEX/C PO Take 1 tablet by mouth daily.    Yes [provider]  valACYclovir (VALTREX) 1000 MG tablet Take 1 tablet (1,000 mg total) by mouth 3 (three) times daily. Patient not taking: Reported on 08/15/2021 09/04/20   Laurey Morale, MD     Family History  Problem Relation Age of Onset   Cancer Father    Diabetes Sister    Gout Sister    Heart disease Sister    Diabetes Sister    Hypertension Sister    Hyperlipidemia Sister    Stomach cancer Neg Hx    Rectal cancer Neg Hx    Colon cancer Neg Hx    Colon polyps Neg Hx     Social History   Socioeconomic History   Marital status: Single    Spouse name: Not on file   Number of children: 0   Years of education: 13   Highest education level: Not on file  Occupational History   Occupation: Route driver  Tobacco Use   Smoking status: Former    Packs/day: 0.25    Years: 30.00    Pack years: 7.50    Types: Cigarettes    Quit date: 03/12/2011    Years since quitting: 10.4   Smokeless tobacco: Never  Vaping Use   Vaping Use: Never used  Substance and Sexual Activity   Alcohol use: No   Drug use: No   Sexual activity: Yes    Partners: Female  Other Topics Concern   Not on  file  Social History Narrative   HSG. [employer] - 2 years - diploma. Married '94- divorced '97. No children.  Lives alone. Work [employer] teacher;   Social Determinants of Health   Financial Resource Strain: Not on file  Food Insecurity: Not on file  Transportation Needs: Not on file  Physical Activity: Not on file  Stress: Not on file  Social Connections: Not on file    Review of Systems: A 12 point ROS discussed and pertinent positives are indicated in the HPI above.  All other systems are negative.  Review of Systems  Constitutional:  Negative for activity change, fatigue and fever.  Respiratory:  Negative for cough and shortness of breath.   Cardiovascular:  Positive for leg swelling.  Gastrointestinal:  Negative for abdominal pain, diarrhea, nausea and vomiting.   Psychiatric/Behavioral:  Negative for behavioral problems and confusion.    Vital Signs: BP (!) 156/92 (BP Location: Right Arm)    Pulse (!) 57    Temp 98 F (36.7 C)    Ht 5\' 7"  (1.702 m)    Wt 161 lb (73 kg)    SpO2 100%    BMI 25.22 kg/m   Physical Exam Vitals reviewed.  HENT:     Mouth/Throat:     Mouth: Mucous membranes are moist.  Cardiovascular:     Rate and Rhythm: Normal rate and regular rhythm.     Heart sounds: Normal heart sounds.  Pulmonary:     Effort: Pulmonary effort is normal.     Breath sounds: Normal breath sounds.  Abdominal:     Palpations: Abdomen is soft.     Tenderness: There is no abdominal tenderness.  Musculoskeletal:        General: Normal range of motion.     Right lower leg: Edema present.     Left lower leg: Edema present.  Skin:    General: Skin is warm.  Neurological:     Mental Status: He is alert and oriented to person, place, and time.  Psychiatric:        Behavior: Behavior normal.    Imaging: No results found.  Labs:  CBC: Recent Labs    09/17/20 0939 06/04/21 1013  WBC 5.6 5.7  HGB 13.1 13.8  HCT 38.7* 41.1  PLT 340.0 301.0    COAGS: No results for input(s): INR, APTT in the last 8760 hours.  BMP: Recent Labs    09/17/20 0939 06/04/21 1013  NA 142 141  K 4.8 3.7  CL 108 107  CO2 27 26  GLUCOSE 103* 99  BUN 10 11  CALCIUM 9.8 8.7  CREATININE 0.87 0.82    LIVER FUNCTION TESTS: Recent Labs    09/17/20 0939 06/04/21 1013 06/05/21 1432 07/26/21 0804  BILITOT 0.4 0.7  --   --   AST 20 35  --   --   ALT 12 22  --   --   ALKPHOS 57 63  --   --   PROT 7.3 6.6 6.3   6.1 5.4*  ALBUMIN 4.2 3.5  --   --     TUMOR MARKERS: No results for input(s): AFPTM, CEA, CA199, CHROMGRNA in the last 8760 hours.  Assessment and Plan:  Proteinuria Scheduled for random renal biopsy per nephrology Risks and benefits of random renal biopsy was discussed with the patient and/or patient's family including, but not  limited to bleeding, infection, damage to adjacent structures or low yield requiring additional tests.  All of the questions  were answered and there is agreement to proceed.  Consent signed and in chart.   Thank you for this interesting consult.  I greatly enjoyed meeting Saber Petricca Gene and look forward to participating in their care.  A copy of this report was sent to the requesting provider on this date.  Electronically Signed: Lavonia Drafts, PA-C 08/20/2021, 7:24 AM   I spent a total of  30 Minutes   in face to face in clinical consultation, greater than 50% of which was counseling/coordinating care for random renal biopsy

## 2021-08-29 ENCOUNTER — Encounter (HOSPITAL_COMMUNITY): Payer: Self-pay

## 2021-08-29 LAB — SURGICAL PATHOLOGY

## 2021-09-06 ENCOUNTER — Encounter: Payer: Self-pay | Admitting: Family Medicine

## 2021-09-06 DIAGNOSIS — R809 Proteinuria, unspecified: Secondary | ICD-10-CM | POA: Diagnosis not present

## 2021-09-09 ENCOUNTER — Other Ambulatory Visit: Payer: Self-pay | Admitting: Family Medicine

## 2021-09-13 DIAGNOSIS — E1129 Type 2 diabetes mellitus with other diabetic kidney complication: Secondary | ICD-10-CM | POA: Diagnosis not present

## 2021-09-13 DIAGNOSIS — N022 Recurrent and persistent hematuria with diffuse membranous glomerulonephritis: Secondary | ICD-10-CM | POA: Diagnosis not present

## 2021-09-13 DIAGNOSIS — R809 Proteinuria, unspecified: Secondary | ICD-10-CM | POA: Diagnosis not present

## 2021-09-13 DIAGNOSIS — E785 Hyperlipidemia, unspecified: Secondary | ICD-10-CM | POA: Diagnosis not present

## 2021-09-24 ENCOUNTER — Telehealth: Payer: Self-pay | Admitting: Family Medicine

## 2021-09-24 ENCOUNTER — Ambulatory Visit (HOSPITAL_COMMUNITY)
Admission: EM | Admit: 2021-09-24 | Discharge: 2021-09-24 | Disposition: A | Discharge status: Home / Self Care | Payer: BC Managed Care – PPO | Attending: Family Medicine | Admitting: Family Medicine

## 2021-09-24 ENCOUNTER — Other Ambulatory Visit: Payer: Self-pay

## 2021-09-24 ENCOUNTER — Encounter (HOSPITAL_COMMUNITY): Payer: Self-pay | Admitting: Emergency Medicine

## 2021-09-24 DIAGNOSIS — R600 Localized edema: Secondary | ICD-10-CM

## 2021-09-24 DIAGNOSIS — N5089 Other specified disorders of the male genital organs: Secondary | ICD-10-CM

## 2021-09-24 LAB — COMPREHENSIVE METABOLIC PANEL
ALT: 24 U/L (ref 0–44)
AST: 34 U/L (ref 15–41)
Albumin: 1.8 g/dL — ABNORMAL LOW (ref 3.5–5.0)
Alkaline Phosphatase: 63 U/L (ref 38–126)
Anion gap: 6 (ref 5–15)
BUN: 14 mg/dL (ref 8–23)
CO2: 27 mmol/L (ref 22–32)
Calcium: 8.2 mg/dL — ABNORMAL LOW (ref 8.9–10.3)
Chloride: 108 mmol/L (ref 98–111)
Creatinine, Ser: 1.08 mg/dL (ref 0.61–1.24)
GFR, Estimated: >60 mL/min (ref >60)
Glucose, Bld: 125 mg/dL — ABNORMAL HIGH (ref 70–99)
Potassium: 4.1 mmol/L (ref 3.5–5.1)
Sodium: 141 mmol/L (ref 135–145)
Total Bilirubin: 0.1 mg/dL — ABNORMAL LOW (ref 0.3–1.2)
Total Protein: 5.1 g/dL — ABNORMAL LOW (ref 6.5–8.1)

## 2021-09-24 LAB — POCT URINALYSIS DIPSTICK, ED / UC
Bilirubin Urine: NEGATIVE
Glucose, UA: NEGATIVE mg/dL
Leukocytes,Ua: NEGATIVE
Nitrite: NEGATIVE
Protein, ur: >=300 mg/dL — AB
Specific Gravity, Urine: 1.02 (ref 1.005–1.030)
Urobilinogen, UA: 0.2 mg/dL (ref 0.0–1.0)
pH: 5.5 (ref 5.0–8.0)

## 2021-09-24 MED ORDER — FUROSEMIDE 40 MG PO TABS: ORAL_TABLET | ORAL | 0 refills | Status: DC

## 2021-09-24 NOTE — Telephone Encounter (Signed)
Last OV-07/26/21.   See message

## 2021-09-24 NOTE — Telephone Encounter (Signed)
Left pt a detailed message to call the office to schedule an office visit per Dr Sarajane Jews

## 2021-09-24 NOTE — Telephone Encounter (Signed)
Triage nurse call and stated pt need to be seen within 4 hrs pt have swelling in the groin area and thighs.

## 2021-09-24 NOTE — Telephone Encounter (Signed)
We can see him this afternoon

## 2021-09-24 NOTE — ED Triage Notes (Signed)
Left ear pain for one week.    Reports "fluid built up in testicles".  Noticed Saturday and Sunday and worsened yesterday.    Patient reports he has had fluid in legs from kidney issues, but not like this.  Bilateral legs tight and swollen.    "Supposed to start infusion treatments soon"

## 2021-09-24 NOTE — Telephone Encounter (Signed)
FYI Spoke with pt stated that he went to [facility]gent  [facility] and they increased his Lasix to taking twice daily per pt. Pt advised to f/u with Dr Clent Ridges if not feeling better.

## 2021-09-24 NOTE — Telephone Encounter (Signed)
Noted  

## 2021-09-24 NOTE — Discharge Instructions (Addendum)
Go ahead and take an extra 40mg  Lasix in the mornings. I would recommend that you call your nephrologist today in order to have a plan in place should your swelling not go down.  You have had labs (blood work) sent today. We will call you with any significant abnormalities or if there is need to begin or change treatment or pursue further follow up.  You may also review your test results online through MyChart. If you do not have a MyChart account, instructions to sign up should be on your discharge paperwork.

## 2021-09-24 NOTE — ED Notes (Signed)
Dr hagler reevaluated left ear post irrigation and discharged patient

## 2021-09-25 NOTE — ED Provider Notes (Signed)
[facility]   [id number] 09/24/21 Arrival Time: 1156  ASSESSMENT & PLAN:  1. Bilateral lower extremity edema   2. Scrotal edema    Will call his nephrologist today in order to arrange f/u. Will begin extra dose of Lasix for the next several days.  Meds ordered this encounter  Medications   furosemide (LASIX) 40 MG tablet    Sig: Take 1 pill daily (in addition to your regular dose).    Dispense:  10 tablet    Refill:  0   Labs Reviewed  POCT URINALYSIS DIPSTICK, ED / UC - Abnormal; Notable for the following components:   Ketones, ur TRACE (*)    Hgb urine dipstick MODERATE (*)    Protein, ur >=300 (*)    All other components within normal limits   CMP pending.   Follow-up Information     [facility] EMERGENCY DEPARTMENT.   Specialty: Emergency Medicine Why: If symptoms worsen in any way. Contact information: [address] [phone] [place] [address] [phone]                Reviewed expectations re: course of current medical issues. Questions answered. Outlined signs and symptoms indicating need for more acute intervention. Patient verbalized understanding. After Visit Summary given.   SUBJECTIVE: History from: patient.  Daniel Jones is a 66 y.o. male who presents with complaint slowly progressive worsening of bilateral LE edema. Now scrotum is swollen as well. Uncomfortable. Normal urination. Normal PO intake without n/v/d. No extremity sensation changes or weakness. Ambulatory. Followed by nephrology for newly dx glomerulonephritis. H/O nephrotic syndrome. No upper extremity or facial swelling.  Past Surgical History:  Procedure Laterality Date   CIRCUMCISION     1991   COLONOSCOPY  05/24/2018   per Dr. Orvan Falconer, no polyps, diverticulosis, repeat in 10 yrs    OBJECTIVE:  Vitals:   09/24/21 1252  BP: (!) 175/84  Pulse: (!) 57  Resp: 20  Temp: 98 F (36.7 C)  TempSrc: Oral   SpO2: 96%    General appearance: alert; no distress Oropharynx: moist Lungs: clear to auscultation bilaterally; unlabored Heart: regular Abdomen: soft; non-distended; no significant abdominal tenderness Back: no CVA tenderness Extremities: bilateral 2+ LE edema extending into scrotum Skin: warm; dry Neurologic: normal gait Psychological: alert and cooperative; normal mood and affect  Labs:  Labs Reviewed  POCT URINALYSIS DIPSTICK, ED / UC - Abnormal; Notable for the following components:   Ketones, ur TRACE (*)    Hgb urine dipstick MODERATE (*)    Protein, ur >=300 (*)    All other components within normal limits     No Known Allergies                                             Past Medical History:  Diagnosis Date   Allergic rhinitis due to pollen    Allergy    Diabetes mellitus without complication ([facility])    GERD (gastroesophageal reflux disease)    History of sexually transmitted disease    Hyperlipidemia    Social History   Socioeconomic History   Marital status: Single    Spouse name: Not on file   Number of children: 0   Years of education: 13   Highest education level: Not on file  Occupational History   Occupation: Route driver  Tobacco Use  Smoking status: Former    Packs/day: 0.25    Years: 30.00    Pack years: 7.50    Types: Cigarettes    Quit date: 03/12/2011    Years since quitting: 10.5   Smokeless tobacco: Never  Vaping Use   Vaping Use: Never used  Substance and Sexual Activity   Alcohol use: No   Drug use: No   Sexual activity: Yes    Partners: Female  Other Topics Concern   Not on file  Social History Narrative   HSG. [employer] - 2 years - diploma. Married '94- divorced '97. No children.  Lives alone. Work [employer] teacher;   Social Determinants of Health   Financial Resource Strain: Not on file  Food Insecurity: Not on file  Transportation Needs: Not on file  Physical Activity: Not on file  Stress: Not on file   Social Connections: Not on file  Intimate Partner Violence: Not on file   Family History  Problem Relation Age of Onset   Cancer Father    Diabetes Sister    Gout Sister    Heart disease Sister    Diabetes Sister    Hypertension Sister    Hyperlipidemia Sister    Stomach cancer Neg Hx    Rectal cancer Neg Hx    Colon cancer Neg Hx    Colon polyps Neg Hx       Mardella Layman, MD 09/25/21 [phone]

## 2021-09-26 ENCOUNTER — Emergency Department (HOSPITAL_COMMUNITY)
Admission: EM | Admit: 2021-09-26 | Discharge: 2021-09-26 | Disposition: A | Discharge status: Home / Self Care | Payer: BC Managed Care – PPO | Attending: Emergency Medicine | Admitting: Emergency Medicine

## 2021-09-26 ENCOUNTER — Other Ambulatory Visit: Payer: Self-pay

## 2021-09-26 ENCOUNTER — Emergency Department (HOSPITAL_BASED_OUTPATIENT_CLINIC_OR_DEPARTMENT_OTHER): Payer: BC Managed Care – PPO

## 2021-09-26 ENCOUNTER — Encounter (HOSPITAL_COMMUNITY): Payer: Self-pay

## 2021-09-26 DIAGNOSIS — R609 Edema, unspecified: Secondary | ICD-10-CM

## 2021-09-26 DIAGNOSIS — Z79899 Other long term (current) drug therapy: Secondary | ICD-10-CM | POA: Diagnosis not present

## 2021-09-26 DIAGNOSIS — R600 Localized edema: Secondary | ICD-10-CM | POA: Diagnosis not present

## 2021-09-26 DIAGNOSIS — Z7984 Long term (current) use of oral hypoglycemic drugs: Secondary | ICD-10-CM | POA: Diagnosis not present

## 2021-09-26 DIAGNOSIS — N5089 Other specified disorders of the male genital organs: Secondary | ICD-10-CM | POA: Diagnosis not present

## 2021-09-26 LAB — CBC WITH DIFFERENTIAL/PLATELET
Abs Immature Granulocytes: 0.01 10*3/uL (ref 0.00–0.07)
Basophils Absolute: 0.1 10*3/uL (ref 0.0–0.1)
Basophils Relative: 1 %
Eosinophils Absolute: 0.3 10*3/uL (ref 0.0–0.5)
Eosinophils Relative: 5 %
HCT: 33.5 % — ABNORMAL LOW (ref 39.0–52.0)
Hemoglobin: 11 g/dL — ABNORMAL LOW (ref 13.0–17.0)
Immature Granulocytes: 0 %
Lymphocytes Relative: 27 %
Lymphs Abs: 1.5 10*3/uL (ref 0.7–4.0)
MCH: 32.4 pg (ref 26.0–34.0)
MCHC: 32.8 g/dL (ref 30.0–36.0)
MCV: 98.8 fL (ref 80.0–100.0)
Monocytes Absolute: 0.8 10*3/uL (ref 0.1–1.0)
Monocytes Relative: 14 %
Neutro Abs: 2.9 10*3/uL (ref 1.7–7.7)
Neutrophils Relative %: 53 %
Platelets: 392 10*3/uL (ref 150–400)
RBC: 3.39 MIL/uL — ABNORMAL LOW (ref 4.22–5.81)
RDW: 12.8 % (ref 11.5–15.5)
WBC: 5.5 10*3/uL (ref 4.0–10.5)
nRBC: 0 % (ref 0.0–0.2)

## 2021-09-26 LAB — URINALYSIS, ROUTINE W REFLEX MICROSCOPIC
Bacteria, UA: NONE SEEN
Bilirubin Urine: NEGATIVE
Glucose, UA: NEGATIVE mg/dL
Hgb urine dipstick: NEGATIVE
Ketones, ur: NEGATIVE mg/dL
Leukocytes,Ua: NEGATIVE
Nitrite: NEGATIVE
Protein, ur: 100 mg/dL — AB
Specific Gravity, Urine: 1.004 — ABNORMAL LOW (ref 1.005–1.030)
pH: 6 (ref 5.0–8.0)

## 2021-09-26 LAB — COMPREHENSIVE METABOLIC PANEL
ALT: 22 U/L (ref 0–44)
AST: 32 U/L (ref 15–41)
Albumin: 1.8 g/dL — ABNORMAL LOW (ref 3.5–5.0)
Alkaline Phosphatase: 62 U/L (ref 38–126)
Anion gap: 6 (ref 5–15)
BUN: 14 mg/dL (ref 8–23)
CO2: 25 mmol/L (ref 22–32)
Calcium: 7.8 mg/dL — ABNORMAL LOW (ref 8.9–10.3)
Chloride: 108 mmol/L (ref 98–111)
Creatinine, Ser: 1 mg/dL (ref 0.61–1.24)
GFR, Estimated: >60 mL/min (ref >60)
Glucose, Bld: 127 mg/dL — ABNORMAL HIGH (ref 70–99)
Potassium: 4.5 mmol/L (ref 3.5–5.1)
Sodium: 139 mmol/L (ref 135–145)
Total Bilirubin: 0.3 mg/dL (ref 0.3–1.2)
Total Protein: 4.9 g/dL — ABNORMAL LOW (ref 6.5–8.1)

## 2021-09-26 LAB — BRAIN NATRIURETIC PEPTIDE: B Natriuretic Peptide: 69.1 pg/mL (ref 0.0–100.0)

## 2021-09-26 MED ORDER — FUROSEMIDE 10 MG/ML IJ SOLN
80.0000 mg | Freq: Once | INTRAMUSCULAR | Status: AC
Start: 2021-09-26 — End: 2021-09-26
  Administered 2021-09-26: 80 mg via INTRAVENOUS
  Filled 2021-09-26: qty 8

## 2021-09-26 NOTE — ED Triage Notes (Signed)
Reports hx of kidney disease and has had increase swelling to bilateral lower legs.  Denies SOB

## 2021-09-26 NOTE — Discharge Instructions (Signed)
As discussed, it is very importantly follow-up with your nephrologist.  Please call today for an appointment in the coming days.  In the interim, please take your adjusted Lasix regimen, which is 80 mg, twice daily.  Return here for concerning changes in your condition.

## 2021-09-26 NOTE — ED Provider Notes (Signed)
[facility] EMERGENCY DEPARTMENT Provider Note   CSN: [id number] Arrival date & time: 09/26/21  1245     History  Chief Complaint  Patient presents with   Leg Swelling    Daniel Jones is a 66 y.o. male.  HPI Patient presents with concern of ongoing bilateral lower extremity and scrotal edema.  Patient has multiple medical issues, notably he is being evaluated for nephropathy.  He had renal biopsy performed last week, takes Lasix daily.  He notes that after being seen in urgent care 2 days ago, doubling his Lasix dose, he continues to have swelling in his lower extremities.  He denies chest pain, abdominal pain, dyspnea, fever, chills.  He does note recent COVID diagnosis, however.    Home Medications Prior to Admission medications   Medication Sig Start Date End Date Taking? Authorizing Provider  Ascorbic Acid (VITAMIN C PO) Take 1 tablet by mouth daily. With vitamin d   Yes [provider]  clobetasol cream (TEMOVATE) 0.05 % APPLY TO AFFECTED AREA TWICE A DAY Patient taking differently: Apply 1 application topically 2 (two) times daily as needed (irritation). 08/21/20  Yes Nelwyn Salisbury, MD  furosemide (LASIX) 40 MG tablet Take 1 pill daily (in addition to your regular dose). Patient taking differently: Take 80 mg by mouth daily. 09/24/21  Yes Hagler, Arlys John, MD  lisinopril (ZESTRIL) 10 MG tablet Take 10 mg by mouth daily.   Yes [provider]  metFORMIN (GLUCOPHAGE) 1000 MG tablet Take 1 tablet (1,000 mg total) by mouth 2 (two) times daily with a meal. OFFICE VISIT (PHYSICAL) NEEDED FOR ADDITIONAL REFILLS Patient taking differently: Take 1,000 mg by mouth 2 (two) times daily with a meal. 09/04/20  Yes Nelwyn Salisbury, MD  pravastatin (PRAVACHOL) 20 MG tablet TAKE 1 TABLET BY MOUTH EVERY DAY Patient taking differently: Take 20 mg by mouth daily. 09/04/20  Yes Nelwyn Salisbury, MD  SUPER B COMPLEX/C PO Take 1 tablet by mouth daily.   Yes  [provider]  Vitamin D, Cholecalciferol, 10 MCG (400 UNIT) CAPS Take 400 Units by mouth daily.   Yes [provider]      Allergies    Patient has no known allergies.    Review of Systems   Review of Systems  Constitutional:        Per HPI, otherwise negative  HENT:         Per HPI, otherwise negative  Respiratory:         Per HPI, otherwise negative  Cardiovascular:        Per HPI, otherwise negative  Gastrointestinal:  Negative for vomiting.  Endocrine:       Negative aside from HPI  Genitourinary:        Neg aside from HPI   Musculoskeletal:        Per HPI, otherwise negative  Skin: Negative.   Neurological:  Negative for syncope.   Physical Exam Updated Vital Signs BP (!) 144/74    Pulse 68    Temp 98.2 F (36.8 C) (Oral)    Resp 20    Ht 5\' 6"  (1.676 m)    Wt 74.8 kg    SpO2 99%    BMI 26.63 kg/m  Physical Exam Vitals and nursing note reviewed.  Constitutional:      General: He is not in acute distress.    Appearance: He is well-developed.  HENT:     Head: Normocephalic and atraumatic.  Eyes:  Conjunctiva/sclera: Conjunctivae normal.  Cardiovascular:     Rate and Rhythm: Normal rate and regular rhythm.  Pulmonary:     Effort: Pulmonary effort is normal. No respiratory distress.     Breath sounds: No stridor.  Abdominal:     General: There is no distension.  Genitourinary:    Comments: Scrotal edema Musculoskeletal:     Right lower leg: Edema present.     Left lower leg: Edema present.  Skin:    General: Skin is warm and dry.  Neurological:     Mental Status: He is alert and oriented to person, place, and time.    ED Results / Procedures / Treatments   Labs (all labs ordered are listed, but only abnormal results are displayed) Labs Reviewed  CBC WITH DIFFERENTIAL/PLATELET - Abnormal; Notable for the following components:      Result Value   RBC 3.39 (*)    Hemoglobin 11.0 (*)    HCT 33.5 (*)    All other components  within normal limits  COMPREHENSIVE METABOLIC PANEL - Abnormal; Notable for the following components:   Glucose, Bld 127 (*)    Calcium 7.8 (*)    Total Protein 4.9 (*)    Albumin 1.8 (*)    All other components within normal limits  URINALYSIS, ROUTINE W REFLEX MICROSCOPIC - Abnormal; Notable for the following components:   Specific Gravity, Urine 1.004 (*)    Protein, ur 100 (*)    All other components within normal limits  BRAIN NATRIURETIC PEPTIDE    EKG None  Radiology VAS [place] LOWER EXTREMITY VENOUS (DVT) (7a-7p)  Result Date: 09/26/2021  Lower Venous DVT Study Patient Name:  Daniel Jones  Date of Exam:   09/26/2021 Medical Rec #: [id number]              Accession #:    [id number] Date of Birth: 21-Feb-1956              Patient Gender: M Patient Age:   87 years Exam Location:  [facility] Procedure:      VAS [place] LOWER EXTREMITY VENOUS (DVT) Referring Phys: Molly Maduro Ryder Man --------------------------------------------------------------------------------  Indications: Bilateral edema, history CKD.  Comparison Study: No prior studies. Performing Technologist: Jean Rosenthal RDMS, RVT  Examination Guidelines: A complete evaluation includes B-mode imaging, spectral Doppler, color Doppler, and power Doppler as needed of all accessible portions of each vessel. Bilateral testing is considered an integral part of a complete examination. Limited examinations for reoccurring indications may be performed as noted. The reflux portion of the exam is performed with the patient in reverse Trendelenburg.  +---------+---------------+---------+-----------+----------+--------------+  RIGHT     Compressibility Phasicity Spontaneity Properties Thrombus Aging  +---------+---------------+---------+-----------+----------+--------------+  CFV       Full            Yes       Yes                                    +---------+---------------+---------+-----------+----------+--------------+  SFJ       Full                                                              +---------+---------------+---------+-----------+----------+--------------+  FV  Prox   Full                                                             +---------+---------------+---------+-----------+----------+--------------+  FV Mid    Full                                                             +---------+---------------+---------+-----------+----------+--------------+  FV Distal Full                                                             +---------+---------------+---------+-----------+----------+--------------+  PFV       Full                                                             +---------+---------------+---------+-----------+----------+--------------+  POP       Full            Yes       Yes                                    +---------+---------------+---------+-----------+----------+--------------+  PTV       Full                                                             +---------+---------------+---------+-----------+----------+--------------+  PERO      Full                                                             +---------+---------------+---------+-----------+----------+--------------+  Gastroc   Full                                                             +---------+---------------+---------+-----------+----------+--------------+   +---------+---------------+---------+-----------+----------+--------------+  LEFT      Compressibility Phasicity Spontaneity Properties Thrombus Aging  +---------+---------------+---------+-----------+----------+--------------+  CFV       Full            Yes       Yes                                    +---------+---------------+---------+-----------+----------+--------------+  SFJ       Full                                                             +---------+---------------+---------+-----------+----------+--------------+  FV Prox   Full                                                              +---------+---------------+---------+-----------+----------+--------------+  FV Mid    Full                                                             +---------+---------------+---------+-----------+----------+--------------+  FV Distal Full                                                             +---------+---------------+---------+-----------+----------+--------------+  PFV       Full                                                             +---------+---------------+---------+-----------+----------+--------------+  POP       Full            Yes       Yes                                    +---------+---------------+---------+-----------+----------+--------------+  PTV       Full                                                             +---------+---------------+---------+-----------+----------+--------------+  PERO      Full                                                             +---------+---------------+---------+-----------+----------+--------------+  Gastroc   Full                                                             +---------+---------------+---------+-----------+----------+--------------+  Summary: RIGHT: - There is no evidence of deep vein thrombosis in the lower extremity.  - No cystic structure found in the popliteal fossa.  LEFT: - There is no evidence of deep vein thrombosis in the lower extremity.  - No cystic structure found in the popliteal fossa.  *See table(s) above for measurements and observations.    Preliminary     Procedures Procedures    Medications Ordered in ED Medications  furosemide (LASIX) injection 80 mg (80 mg Intravenous Given 09/26/21 1146)    ED Course/ Medical Decision Making/ A&P  This patient presents to the ED for concern of peripheral edema in the context of nephropathy, this involves an extensive number of treatment options, and is a complaint that carries with it a high risk of complications and morbidity.  The differential  diagnosis includes renal failure, ejection, obstruction, collusion   Co morbidities that complicate the patient evaluation  Possible autoimmune nephropathy, recent COVID   Social Determinants of Health:  None   Additional history obtained:  Additional history and/or information obtained from urgent care note from 2 days ago with concern for same External records from outside source obtained and reviewed including discussion with his nephrologist, including ongoing efforts to delineate his nephropathy via biopsy   After the initial evaluation, orders, including: Labs were initiated.  Patient placed on Cardiac and Pulse-Oximetry Monitors. The patient was maintained on a cardiac monitor.  The cardiac monitored showed an rhythm of sinus 70 unremarkable The patient was also maintained on pulse oximetry. The readings were typically a 99% room air On repeat evaluation of the patient improved  Lab Tests:  I personally interpreted labs.  The pertinent results include: Creatinine unremarkable, mild anemia  Imaging Studies ordered:  I independently visualized and interpreted imaging which showed no occlusion in the lower extremities I agree with the radiologist interpretation discussed this with the sonographer  Consultations Obtained:  I requested consultation with the nephrology,  and discussed lab and imaging findings as well as pertinent plan - they recommend: Doubling of home Lasix, IV diuresis here, and discharged to follow-up in the clinic  Dispostion / Final MDM:  After consideration of the diagnostic results and the patient's response to treatment, discharged in stable condition.  Old male with ongoing evaluation for likely nephropathy presents with ongoing bilateral lower extremity edema.  Patient has no evidence for DVT here, studies performed due to his recent COVID, and degree of symptoms.  No evidence for distal neurovascular compromise, no evidence for pneumonia, CHF,  ACS.  Patient discharged to follow-up with nephrology  Final Clinical Impression(s) / ED Diagnoses Final diagnoses:  Peripheral edema    Rx / DC Orders ED Discharge Orders     None         Gerhard Munch, MD 09/26/21 [phone]

## 2021-09-26 NOTE — Progress Notes (Signed)
Lower extremity venous bilateral study completed.  Preliminary results relayed to Jeraldine Loots, MD in ED.  See CV Proc for preliminary results report.   Jean Rosenthal, RDMS, RVT

## 2021-09-30 DIAGNOSIS — N022 Recurrent and persistent hematuria with diffuse membranous glomerulonephritis: Secondary | ICD-10-CM | POA: Diagnosis not present

## 2021-09-30 DIAGNOSIS — E785 Hyperlipidemia, unspecified: Secondary | ICD-10-CM | POA: Diagnosis not present

## 2021-09-30 DIAGNOSIS — E1129 Type 2 diabetes mellitus with other diabetic kidney complication: Secondary | ICD-10-CM | POA: Diagnosis not present

## 2021-09-30 DIAGNOSIS — R809 Proteinuria, unspecified: Secondary | ICD-10-CM | POA: Diagnosis not present

## 2021-10-02 ENCOUNTER — Other Ambulatory Visit: Payer: Self-pay | Admitting: Family Medicine

## 2021-10-04 DIAGNOSIS — N042 Nephrotic syndrome with diffuse membranous glomerulonephritis: Secondary | ICD-10-CM | POA: Diagnosis not present

## 2021-10-04 DIAGNOSIS — R809 Proteinuria, unspecified: Secondary | ICD-10-CM | POA: Diagnosis not present

## 2021-10-14 DIAGNOSIS — N022 Recurrent and persistent hematuria with diffuse membranous glomerulonephritis: Secondary | ICD-10-CM | POA: Diagnosis not present

## 2021-10-14 DIAGNOSIS — R809 Proteinuria, unspecified: Secondary | ICD-10-CM | POA: Diagnosis not present

## 2021-10-14 DIAGNOSIS — E785 Hyperlipidemia, unspecified: Secondary | ICD-10-CM | POA: Diagnosis not present

## 2021-10-14 DIAGNOSIS — E1129 Type 2 diabetes mellitus with other diabetic kidney complication: Secondary | ICD-10-CM | POA: Diagnosis not present

## 2021-10-18 ENCOUNTER — Ambulatory Visit: Payer: BC Managed Care – PPO | Admitting: Family Medicine

## 2021-10-18 ENCOUNTER — Encounter: Payer: Self-pay | Admitting: Family Medicine

## 2021-10-18 VITALS — BP 118/60 | HR 52 | Temp 98.2°F | Wt 141.0 lb

## 2021-10-18 DIAGNOSIS — E119 Type 2 diabetes mellitus without complications: Secondary | ICD-10-CM | POA: Diagnosis not present

## 2021-10-18 LAB — POCT GLYCOSYLATED HEMOGLOBIN (HGB A1C): Hemoglobin A1C: 6.3 % — AB (ref 4.0–5.6)

## 2021-10-18 NOTE — Progress Notes (Signed)
? ?  Subjective:  ? ? Patient ID: Daniel Jones, male    DOB: June 12, 1956, 66 y.o.   MRN: [id number] ? 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 So they directed him here. He feels fine. BP is stable  ? ? ?Review of Systems ? ?   ?Objective:  ? Physical Exam ? ? ? ? ?   ?Assessment & Plan:  ? ? ?

## 2021-11-08 DIAGNOSIS — E785 Hyperlipidemia, unspecified: Secondary | ICD-10-CM | POA: Diagnosis not present

## 2021-11-08 DIAGNOSIS — N022 Recurrent and persistent hematuria with diffuse membranous glomerulonephritis: Secondary | ICD-10-CM | POA: Diagnosis not present

## 2021-11-15 ENCOUNTER — Other Ambulatory Visit (HOSPITAL_COMMUNITY): Payer: Self-pay

## 2021-11-18 ENCOUNTER — Ambulatory Visit (HOSPITAL_COMMUNITY)
Admission: RE | Admit: 2021-11-18 | Discharge: 2021-11-18 | Disposition: A | Discharge status: Home / Self Care | Payer: BC Managed Care – PPO | Source: Ambulatory Visit | Attending: Nephrology | Admitting: Nephrology

## 2021-11-18 DIAGNOSIS — R809 Proteinuria, unspecified: Secondary | ICD-10-CM | POA: Insufficient documentation

## 2021-11-18 DIAGNOSIS — N022 Recurrent and persistent hematuria with diffuse membranous glomerulonephritis: Secondary | ICD-10-CM | POA: Insufficient documentation

## 2021-11-18 MED ORDER — DIPHENHYDRAMINE HCL 50 MG/ML IJ SOLN
INTRAMUSCULAR | Status: AC
  Filled 2021-11-18: qty 1

## 2021-11-18 MED ORDER — METHYLPREDNISOLONE SODIUM SUCC 125 MG IJ SOLR
INTRAMUSCULAR | Status: AC
  Filled 2021-11-18: qty 2

## 2021-11-18 MED ORDER — ACETAMINOPHEN 325 MG PO TABS
650.0000 mg | ORAL_TABLET | Freq: Once | ORAL | Status: AC
  Administered 2021-11-18: 650 mg via ORAL

## 2021-11-18 MED ORDER — SODIUM CHLORIDE 0.9 % IV SOLN
1000.0000 mg | Freq: Once | INTRAVENOUS | Status: AC
  Administered 2021-11-18: 1000 mg via INTRAVENOUS
  Filled 2021-11-18: qty 100

## 2021-11-18 MED ORDER — ACETAMINOPHEN 325 MG PO TABS
ORAL_TABLET | ORAL | Status: AC
  Filled 2021-11-18: qty 2

## 2021-11-18 MED ORDER — METHYLPREDNISOLONE SODIUM SUCC 125 MG IJ SOLR
125.0000 mg | Freq: Once | INTRAMUSCULAR | Status: AC
  Administered 2021-11-18: 125 mg via INTRAVENOUS

## 2021-11-18 MED ORDER — DIPHENHYDRAMINE HCL 50 MG/ML IJ SOLN
25.0000 mg | Freq: Once | INTRAMUSCULAR | Status: AC
  Administered 2021-11-18: 25 mg via INTRAVENOUS

## 2021-11-21 DIAGNOSIS — N022 Recurrent and persistent hematuria with diffuse membranous glomerulonephritis: Secondary | ICD-10-CM | POA: Diagnosis not present

## 2021-11-21 DIAGNOSIS — E1129 Type 2 diabetes mellitus with other diabetic kidney complication: Secondary | ICD-10-CM | POA: Diagnosis not present

## 2021-11-21 DIAGNOSIS — R809 Proteinuria, unspecified: Secondary | ICD-10-CM | POA: Diagnosis not present

## 2021-11-21 DIAGNOSIS — E785 Hyperlipidemia, unspecified: Secondary | ICD-10-CM | POA: Diagnosis not present

## 2021-11-28 ENCOUNTER — Other Ambulatory Visit: Payer: Self-pay | Admitting: Family Medicine

## 2021-11-28 DIAGNOSIS — E119 Type 2 diabetes mellitus without complications: Secondary | ICD-10-CM

## 2022-01-02 DIAGNOSIS — N182 Chronic kidney disease, stage 2 (mild): Secondary | ICD-10-CM | POA: Diagnosis not present

## 2022-01-02 DIAGNOSIS — R809 Proteinuria, unspecified: Secondary | ICD-10-CM | POA: Diagnosis not present

## 2022-01-02 DIAGNOSIS — E1129 Type 2 diabetes mellitus with other diabetic kidney complication: Secondary | ICD-10-CM | POA: Diagnosis not present

## 2022-01-02 DIAGNOSIS — N022 Recurrent and persistent hematuria with diffuse membranous glomerulonephritis: Secondary | ICD-10-CM | POA: Diagnosis not present

## 2022-02-06 DIAGNOSIS — E1129 Type 2 diabetes mellitus with other diabetic kidney complication: Secondary | ICD-10-CM | POA: Diagnosis not present

## 2022-02-06 DIAGNOSIS — N182 Chronic kidney disease, stage 2 (mild): Secondary | ICD-10-CM | POA: Diagnosis not present

## 2022-02-06 DIAGNOSIS — N022 Recurrent and persistent hematuria with diffuse membranous glomerulonephritis: Secondary | ICD-10-CM | POA: Diagnosis not present

## 2022-02-06 DIAGNOSIS — R809 Proteinuria, unspecified: Secondary | ICD-10-CM | POA: Diagnosis not present

## 2022-02-14 ENCOUNTER — Other Ambulatory Visit: Payer: Self-pay | Admitting: Family Medicine

## 2022-03-10 DIAGNOSIS — N182 Chronic kidney disease, stage 2 (mild): Secondary | ICD-10-CM | POA: Diagnosis not present

## 2022-03-10 DIAGNOSIS — E1129 Type 2 diabetes mellitus with other diabetic kidney complication: Secondary | ICD-10-CM | POA: Diagnosis not present

## 2022-03-10 DIAGNOSIS — R809 Proteinuria, unspecified: Secondary | ICD-10-CM | POA: Diagnosis not present

## 2022-03-10 DIAGNOSIS — E785 Hyperlipidemia, unspecified: Secondary | ICD-10-CM | POA: Diagnosis not present

## 2022-03-10 DIAGNOSIS — N022 Recurrent and persistent hematuria with diffuse membranous glomerulonephritis: Secondary | ICD-10-CM | POA: Diagnosis not present

## 2022-03-26 ENCOUNTER — Other Ambulatory Visit (HOSPITAL_COMMUNITY): Payer: Self-pay | Admitting: *Deleted

## 2022-03-27 ENCOUNTER — Ambulatory Visit (HOSPITAL_COMMUNITY)
Admission: RE | Admit: 2022-03-27 | Discharge: 2022-03-27 | Disposition: A | Discharge status: Home / Self Care | Payer: BC Managed Care – PPO | Source: Ambulatory Visit | Attending: Nephrology | Admitting: Nephrology

## 2022-03-27 DIAGNOSIS — R809 Proteinuria, unspecified: Secondary | ICD-10-CM | POA: Diagnosis not present

## 2022-03-27 DIAGNOSIS — N022 Recurrent and persistent hematuria with diffuse membranous glomerulonephritis: Secondary | ICD-10-CM | POA: Diagnosis not present

## 2022-03-27 MED ORDER — METHYLPREDNISOLONE SODIUM SUCC 125 MG IJ SOLR
125.0000 mg | Freq: Once | INTRAMUSCULAR | Status: AC
  Administered 2022-03-27: 125 mg via INTRAVENOUS

## 2022-03-27 MED ORDER — METHYLPREDNISOLONE SODIUM SUCC 125 MG IJ SOLR
INTRAMUSCULAR | Status: AC
  Filled 2022-03-27: qty 2

## 2022-03-27 MED ORDER — DIPHENHYDRAMINE HCL 50 MG/ML IJ SOLN
INTRAMUSCULAR | Status: AC
  Filled 2022-03-27: qty 1

## 2022-03-27 MED ORDER — DIPHENHYDRAMINE HCL 50 MG/ML IJ SOLN
25.0000 mg | Freq: Once | INTRAMUSCULAR | Status: AC
  Administered 2022-03-27: 25 mg via INTRAVENOUS

## 2022-03-27 MED ORDER — ACETAMINOPHEN 325 MG PO TABS
650.0000 mg | ORAL_TABLET | Freq: Once | ORAL | Status: AC
  Administered 2022-03-27: 650 mg via ORAL

## 2022-03-27 MED ORDER — SODIUM CHLORIDE 0.9 % IV SOLN
1000.0000 mg | Freq: Once | INTRAVENOUS | Status: AC
  Administered 2022-03-27: 1000 mg via INTRAVENOUS
  Filled 2022-03-27: qty 100

## 2022-03-27 MED ORDER — ACETAMINOPHEN 325 MG PO TABS
ORAL_TABLET | ORAL | Status: AC
  Filled 2022-03-27: qty 2

## 2022-04-04 DIAGNOSIS — N189 Chronic kidney disease, unspecified: Secondary | ICD-10-CM | POA: Diagnosis not present

## 2022-04-05 ENCOUNTER — Other Ambulatory Visit: Payer: Self-pay | Admitting: Family Medicine

## 2022-04-09 NOTE — Telephone Encounter (Signed)
Pt LOV was on 10/18/2021 Last refill for lasix was on 09/24/2021 Please advise if ok to send Rx refill to pt pharmacy

## 2022-08-07 ENCOUNTER — Other Ambulatory Visit (HOSPITAL_COMMUNITY): Payer: Self-pay | Admitting: *Deleted

## 2022-08-08 ENCOUNTER — Encounter (HOSPITAL_COMMUNITY)
Admission: RE | Admit: 2022-08-08 | Discharge: 2022-08-08 | Disposition: A | Discharge status: Home / Self Care | Payer: Medicare Other | Source: Ambulatory Visit | Attending: Nephrology | Admitting: Nephrology

## 2022-08-08 VITALS — BP 122/65 | HR 52 | Temp 97.3°F | Resp 18 | Wt 157.0 lb

## 2022-08-08 DIAGNOSIS — N062 Isolated proteinuria with diffuse membranous glomerulonephritis, unspecified: Secondary | ICD-10-CM | POA: Diagnosis not present

## 2022-08-08 DIAGNOSIS — R7989 Other specified abnormal findings of blood chemistry: Secondary | ICD-10-CM | POA: Diagnosis not present

## 2022-08-08 DIAGNOSIS — R5383 Other fatigue: Secondary | ICD-10-CM | POA: Insufficient documentation

## 2022-08-08 DIAGNOSIS — N042 Nephrotic syndrome with diffuse membranous glomerulonephritis, unspecified: Secondary | ICD-10-CM

## 2022-08-08 DIAGNOSIS — R809 Proteinuria, unspecified: Secondary | ICD-10-CM | POA: Insufficient documentation

## 2022-08-08 DIAGNOSIS — R600 Localized edema: Secondary | ICD-10-CM | POA: Diagnosis not present

## 2022-08-08 DIAGNOSIS — E119 Type 2 diabetes mellitus without complications: Secondary | ICD-10-CM | POA: Insufficient documentation

## 2022-08-08 DIAGNOSIS — N052 Unspecified nephritic syndrome with diffuse membranous glomerulonephritis: Secondary | ICD-10-CM | POA: Diagnosis not present

## 2022-08-08 MED ORDER — DIPHENHYDRAMINE HCL 50 MG/ML IJ SOLN: 25.0000 mg | Freq: Once | INTRAMUSCULAR | Status: AC

## 2022-08-08 MED ORDER — SODIUM CHLORIDE 0.9 % IV SOLN
1000.0000 mg | Freq: Once | INTRAVENOUS | Status: AC
  Administered 2022-08-08: 1000 mg via INTRAVENOUS
  Filled 2022-08-08: qty 100

## 2022-08-08 MED ORDER — METHYLPREDNISOLONE SODIUM SUCC 125 MG IJ SOLR
INTRAMUSCULAR | Status: AC
  Administered 2022-08-08: 125 mg via INTRAVENOUS
  Filled 2022-08-08: qty 2

## 2022-08-08 MED ORDER — METHYLPREDNISOLONE SODIUM SUCC 125 MG IJ SOLR: 125.0000 mg | Freq: Once | INTRAMUSCULAR | Status: AC

## 2022-08-08 MED ORDER — ACETAMINOPHEN 325 MG PO TABS: 650.0000 mg | ORAL_TABLET | Freq: Once | ORAL | Status: AC

## 2022-08-08 MED ORDER — DIPHENHYDRAMINE HCL 50 MG/ML IJ SOLN
INTRAMUSCULAR | Status: AC
  Administered 2022-08-08: 25 mg via INTRAVENOUS
  Filled 2022-08-08: qty 1

## 2022-08-08 MED ORDER — ACETAMINOPHEN 325 MG PO TABS
ORAL_TABLET | ORAL | Status: AC
  Administered 2022-08-08: 650 mg via ORAL
  Filled 2022-08-08: qty 2

## 2022-08-12 LAB — QUANTIFERON-TB GOLD PLUS (RQFGPL)
QuantiFERON Mitogen Value: 0.11 IU/mL
QuantiFERON Nil Value: 0 IU/mL
QuantiFERON TB1 Ag Value: 0 IU/mL
QuantiFERON TB2 Ag Value: 0 IU/mL

## 2022-08-12 LAB — QUANTIFERON-TB GOLD PLUS: QuantiFERON-TB Gold Plus: UNDETERMINED — AB

## 2022-08-22 ENCOUNTER — Encounter (HOSPITAL_COMMUNITY)
Admission: RE | Admit: 2022-08-22 | Discharge: 2022-08-22 | Disposition: A | Discharge status: Home / Self Care | Payer: Medicare Other | Source: Ambulatory Visit | Attending: Nephrology | Admitting: Nephrology

## 2022-08-22 DIAGNOSIS — N062 Isolated proteinuria with diffuse membranous glomerulonephritis, unspecified: Secondary | ICD-10-CM | POA: Diagnosis not present

## 2022-08-22 MED ORDER — DIPHENHYDRAMINE HCL 50 MG/ML IJ SOLN
25.0000 mg | Freq: Once | INTRAMUSCULAR | Status: AC
  Administered 2022-08-22: 25 mg via INTRAVENOUS

## 2022-08-22 MED ORDER — ACETAMINOPHEN 325 MG PO TABS
650.0000 mg | ORAL_TABLET | Freq: Once | ORAL | Status: AC
  Administered 2022-08-22: 650 mg via ORAL

## 2022-08-22 MED ORDER — DIPHENHYDRAMINE HCL 50 MG/ML IJ SOLN
INTRAMUSCULAR | Status: AC
  Filled 2022-08-22: qty 1

## 2022-08-22 MED ORDER — METHYLPREDNISOLONE SODIUM SUCC 125 MG IJ SOLR
INTRAMUSCULAR | Status: AC
  Filled 2022-08-22: qty 2

## 2022-08-22 MED ORDER — SODIUM CHLORIDE 0.9 % IV SOLN
1000.0000 mg | Freq: Once | INTRAVENOUS | Status: AC
  Administered 2022-08-22: 1000 mg via INTRAVENOUS
  Filled 2022-08-22: qty 100

## 2022-08-22 MED ORDER — METHYLPREDNISOLONE SODIUM SUCC 125 MG IJ SOLR
125.0000 mg | Freq: Once | INTRAMUSCULAR | Status: AC
  Administered 2022-08-22: 125 mg via INTRAVENOUS

## 2022-08-22 MED ORDER — ACETAMINOPHEN 325 MG PO TABS
ORAL_TABLET | ORAL | Status: AC
  Filled 2022-08-22: qty 2

## 2022-10-05 DIAGNOSIS — N042 Nephrotic syndrome with diffuse membranous glomerulonephritis, unspecified: Secondary | ICD-10-CM | POA: Insufficient documentation

## 2022-10-05 NOTE — Addendum Note (Signed)
Encounter addended by: Rosita Fire, MD on: 10/05/2022 2:16 PM  Actions taken: Visit diagnoses modified, Problem List modified

## 2022-11-28 LAB — LAB REPORT - SCANNED
Creatinine, POC: 110.2 mg/dL
EGFR: 53

## 2023-02-17 ENCOUNTER — Other Ambulatory Visit (HOSPITAL_COMMUNITY): Payer: Self-pay

## 2023-02-20 ENCOUNTER — Ambulatory Visit (HOSPITAL_COMMUNITY)
Admission: RE | Admit: 2023-02-20 | Discharge: 2023-02-20 | Disposition: A | Discharge status: Home / Self Care | Payer: Medicare Other | Source: Ambulatory Visit | Attending: Nephrology | Admitting: Nephrology

## 2023-02-20 DIAGNOSIS — R809 Proteinuria, unspecified: Secondary | ICD-10-CM | POA: Diagnosis not present

## 2023-02-20 DIAGNOSIS — N012 Rapidly progressive nephritic syndrome with diffuse membranous glomerulonephritis: Secondary | ICD-10-CM | POA: Insufficient documentation

## 2023-02-20 DIAGNOSIS — N022 Recurrent and persistent hematuria with diffuse membranous glomerulonephritis: Secondary | ICD-10-CM | POA: Insufficient documentation

## 2023-02-20 MED ORDER — METHYLPREDNISOLONE SODIUM SUCC 125 MG IJ SOLR
125.0000 mg | Freq: Once | INTRAMUSCULAR | Status: AC
  Administered 2023-02-20: 125 mg via INTRAVENOUS

## 2023-02-20 MED ORDER — ACETAMINOPHEN 325 MG PO TABS
650.0000 mg | ORAL_TABLET | Freq: Once | ORAL | Status: AC
  Administered 2023-02-20: 650 mg via ORAL

## 2023-02-20 MED ORDER — SODIUM CHLORIDE 0.9 % IV SOLN
1000.0000 mg | Freq: Once | INTRAVENOUS | Status: AC
  Administered 2023-02-20: 1000 mg via INTRAVENOUS
  Filled 2023-02-20: qty 100

## 2023-02-20 MED ORDER — DIPHENHYDRAMINE HCL 50 MG/ML IJ SOLN
25.0000 mg | Freq: Once | INTRAMUSCULAR | Status: AC
  Administered 2023-02-20: 25 mg via INTRAVENOUS

## 2023-03-17 NOTE — Addendum Note (Signed)
Encounter addended by: Claudean Kinds, RN on: 03/17/2023 1:33 PM  Actions taken: Charge Capture section accepted

## 2023-03-26 ENCOUNTER — Other Ambulatory Visit: Payer: Self-pay | Admitting: Family Medicine

## 2023-03-26 DIAGNOSIS — E119 Type 2 diabetes mellitus without complications: Secondary | ICD-10-CM

## 2023-04-25 IMAGING — US US RENAL
1 series · 14 of 25 positions shown · non-contrast
Comparison: None.

CLINICAL DATA: Proteinuria.

EXAM:
RENAL / URINARY TRACT ULTRASOUND COMPLETE

[Series 1: us renal · 0.23mm/px · 14 of 39 slices shown]
[im 1/39]
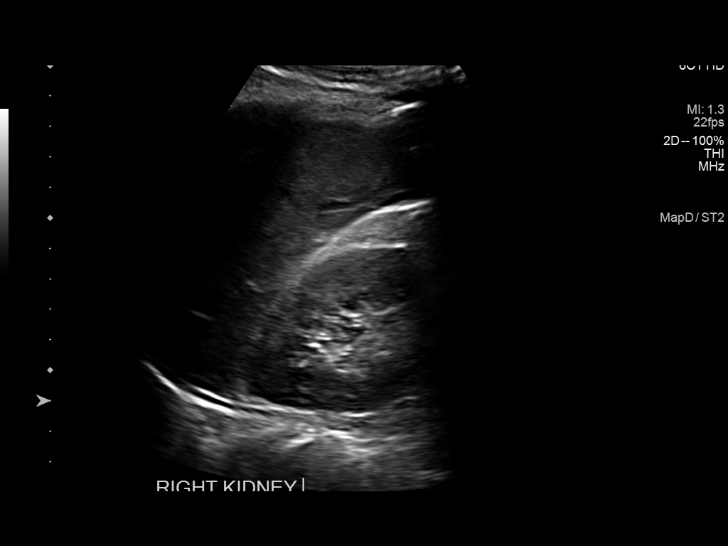
[im 4/39]
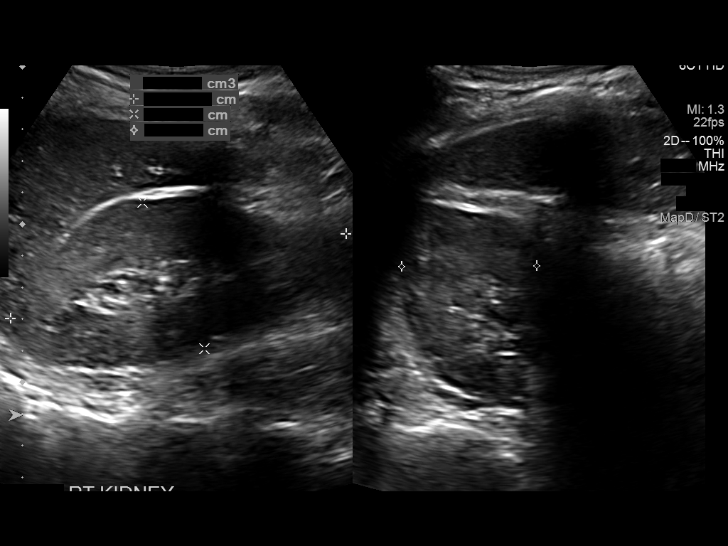
[im 7/39]
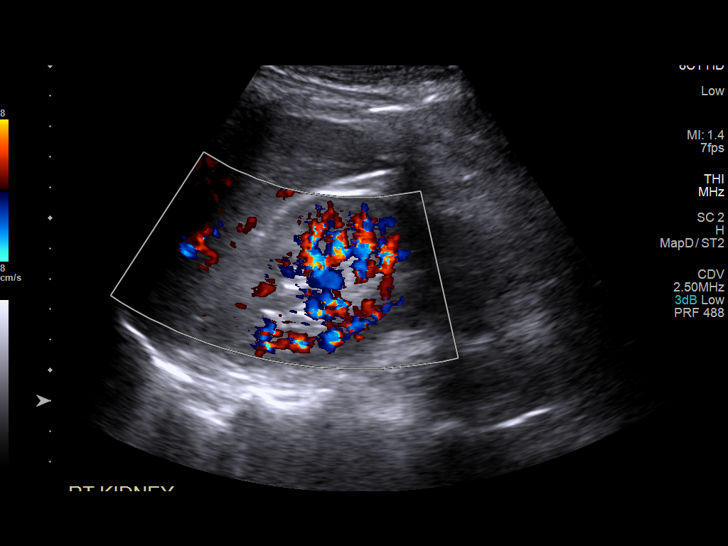
[im 10/39]
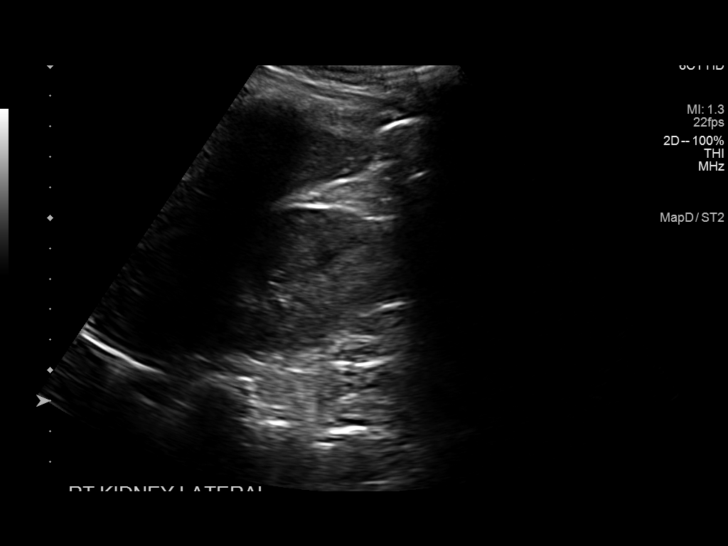
[im 13/39]
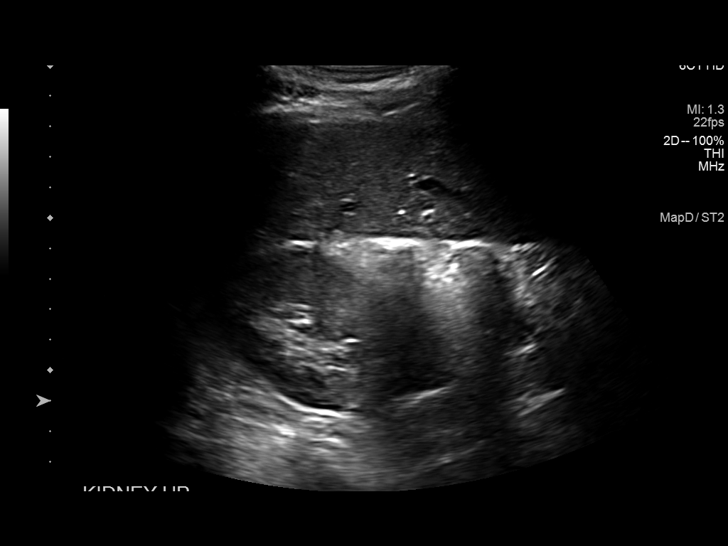
[im 15/39]
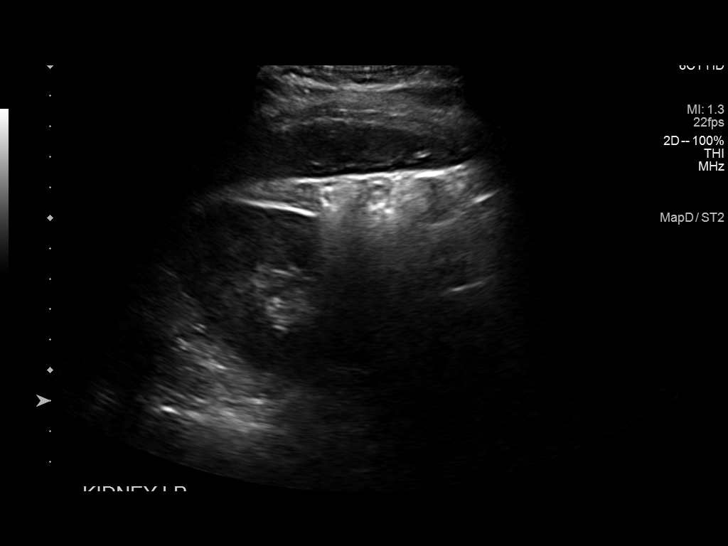
[im 18/39]
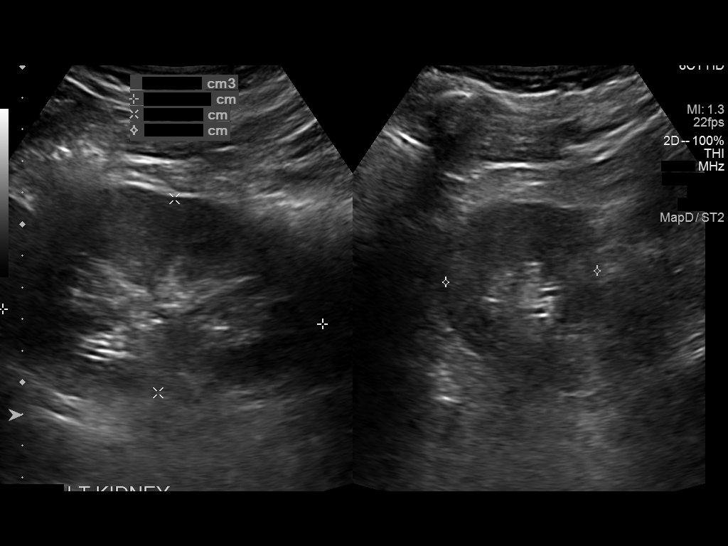
[im 21/39]
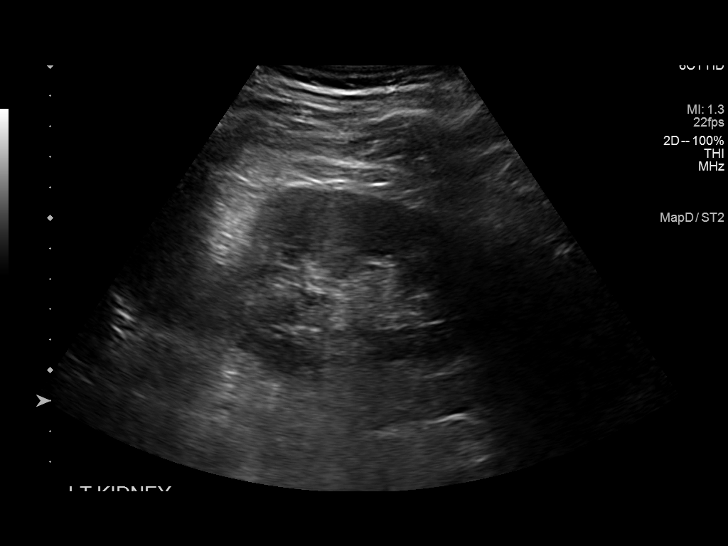
[im 24/39]
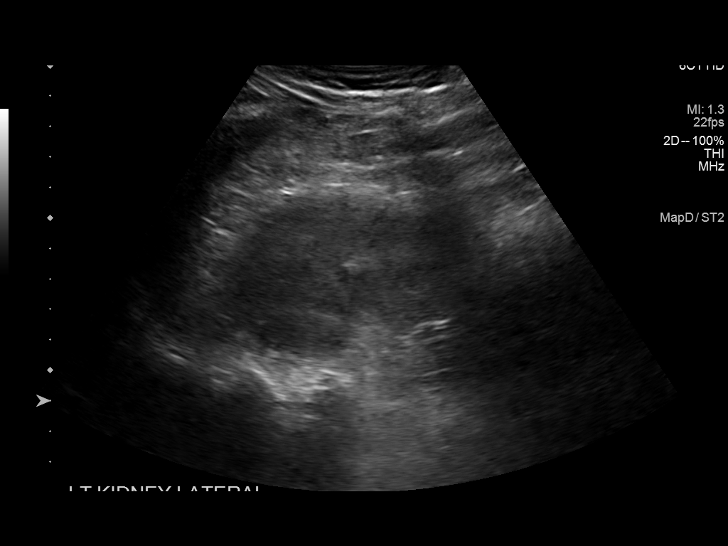
[im 26/39]
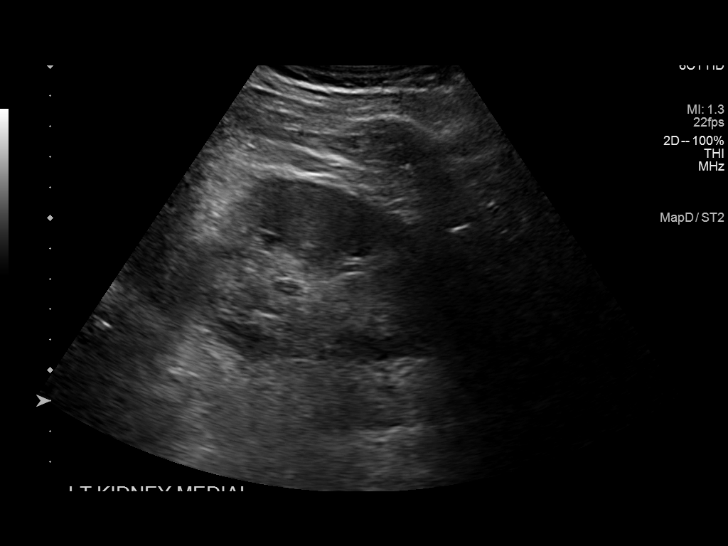
[im 29/39]
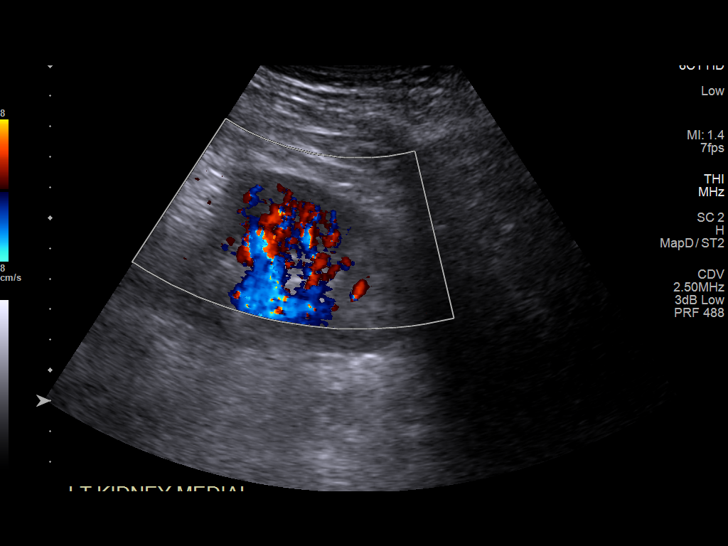
[im 32/39]
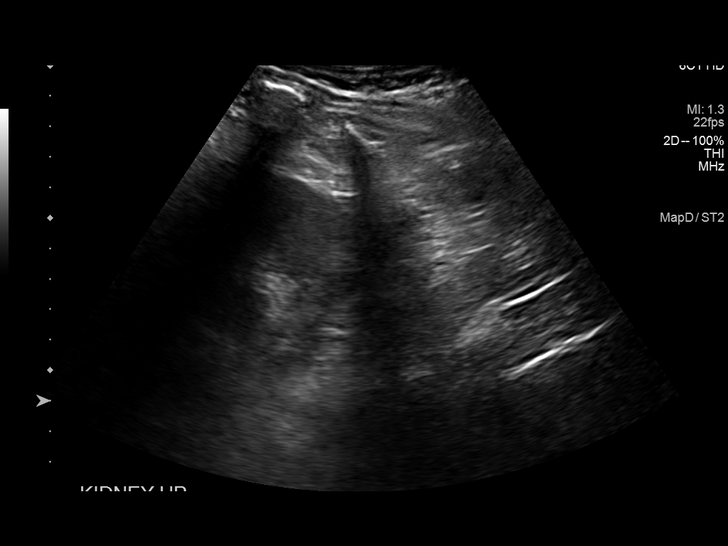
[im 35/39]
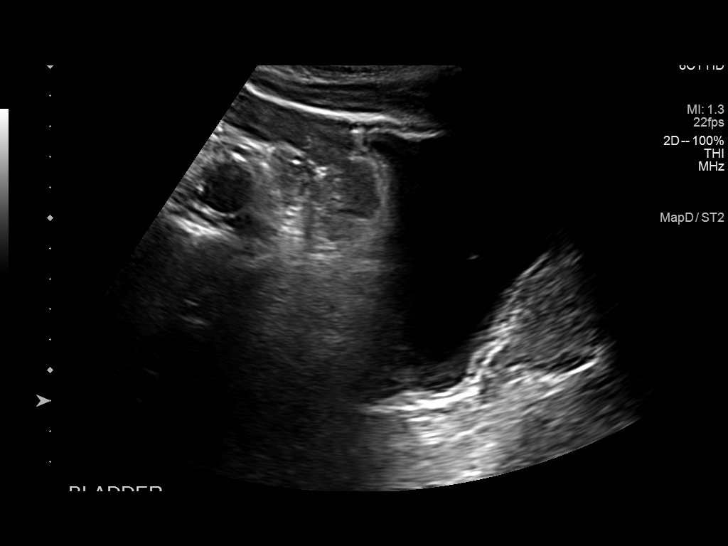
[im 39/39]
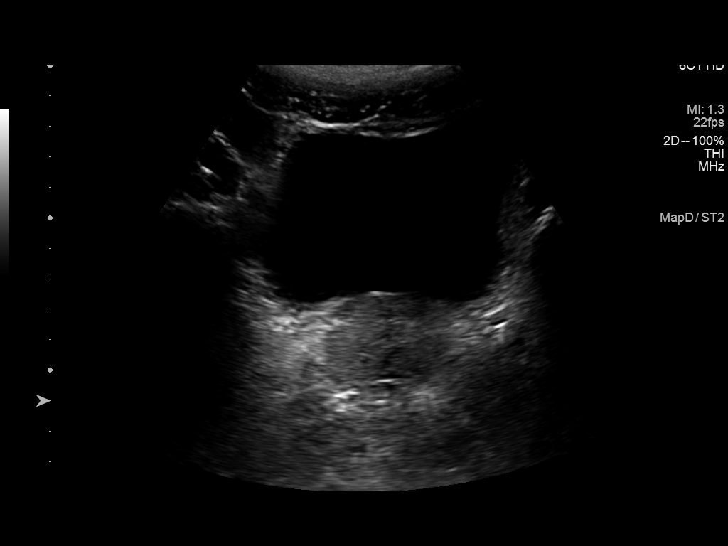

[14 of 25 positions shown; findings below may reference images not displayed]

FINDINGS: Right Kidney:

Renal measurements: 10.9 x 5.0 x 4.3 cm = volume: 123 mL.
Echogenicity within normal limits. No mass or hydronephrosis
visualized.

Left Kidney:

Renal measurements: 10.1 x 6.2 x 4.8 cm = volume: 157 mL.
Echogenicity within normal limits. No mass or hydronephrosis
visualized.

Bladder:

Appears normal for degree of bladder distention.

Other:

None.
IMPRESSION: Unremarkable renal ultrasound.

## 2023-06-05 IMAGING — US US BIOPSY
1 series · 13 of 17 positions shown · non-contrast
Comparison: none

INDICATION: 65-year-old with proteinuria.  Request for random renal biopsy.

[Series 1: us biopsy (kidney) · 13 of 17 slices shown]
[im 1/17]
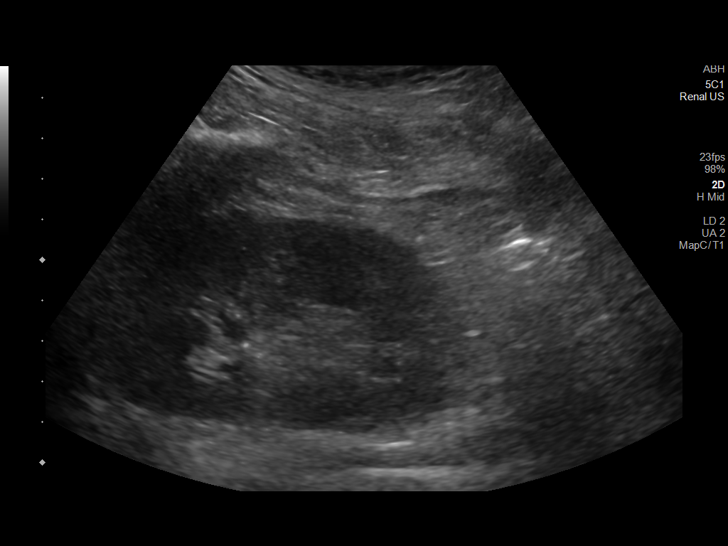
[im 2/17]
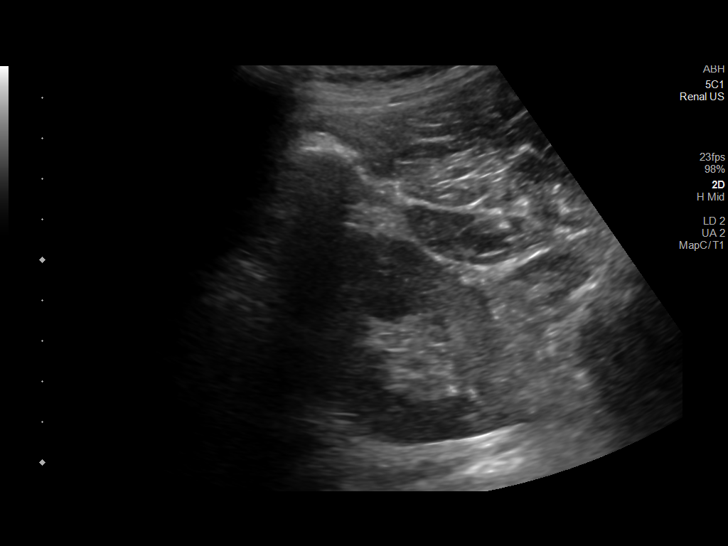
[im 4/17]
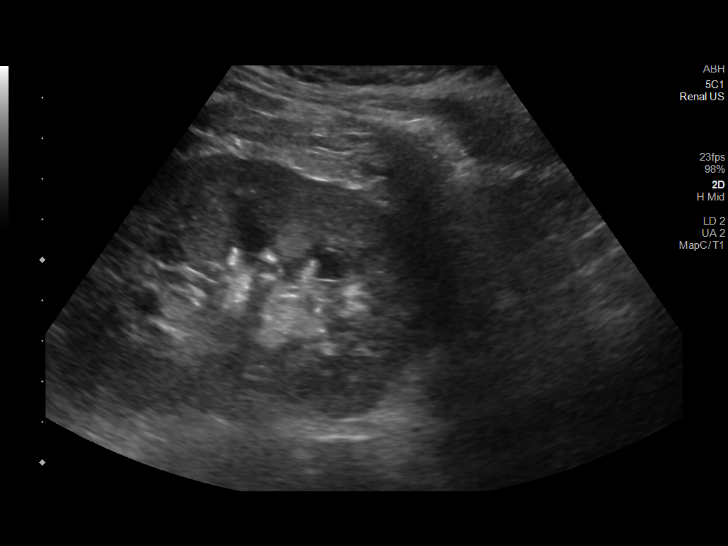
[im 5/17]
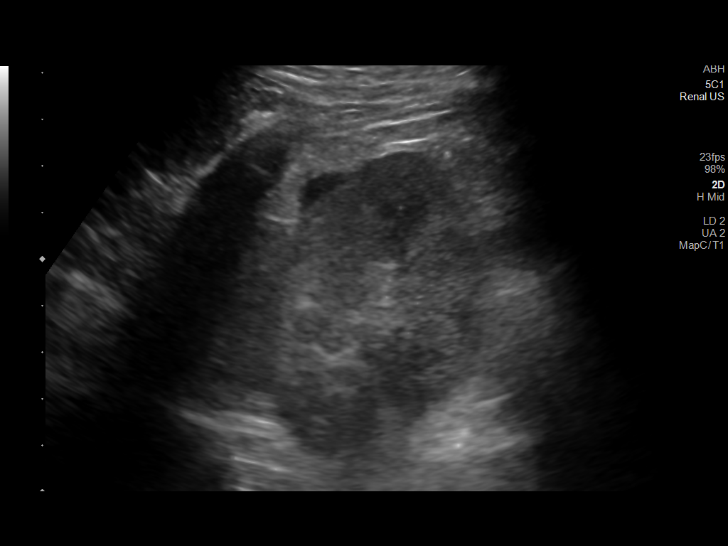
[im 6/17]
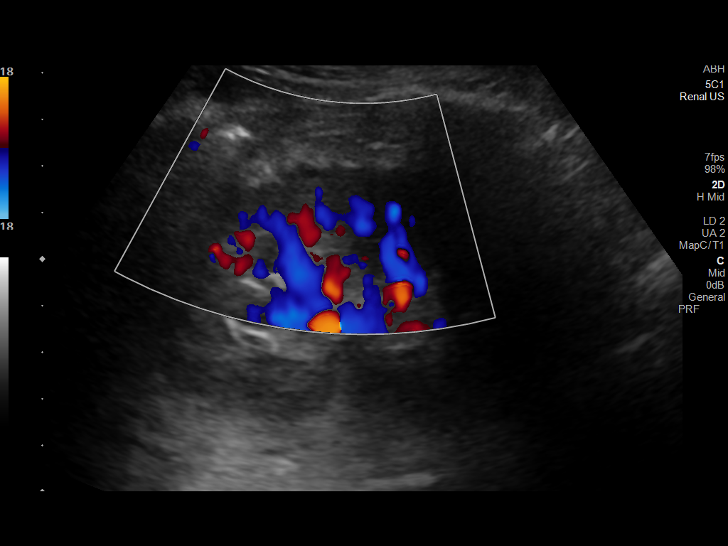
[im 8/17]
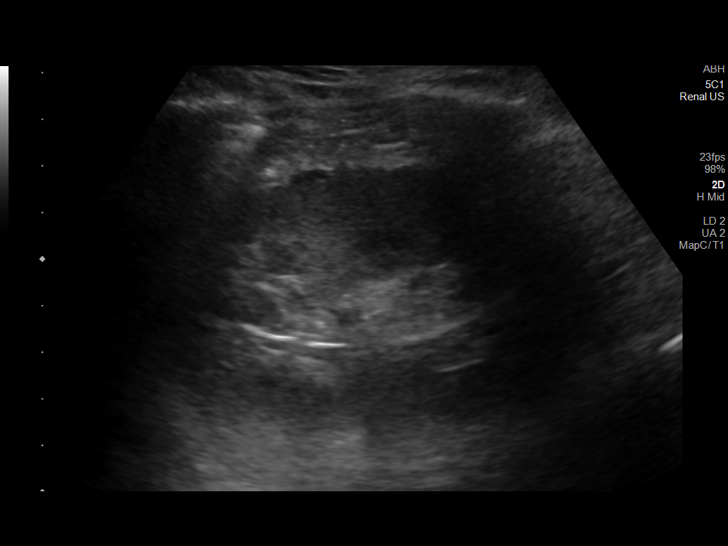
[im 9/17]
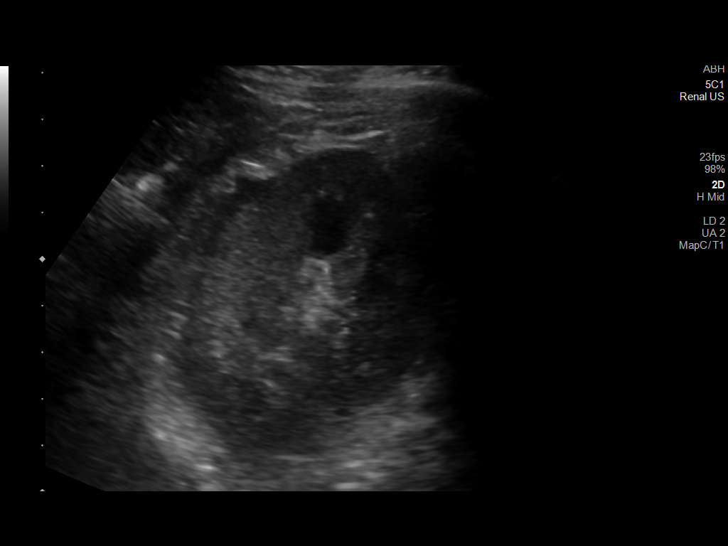
[im 10/17]
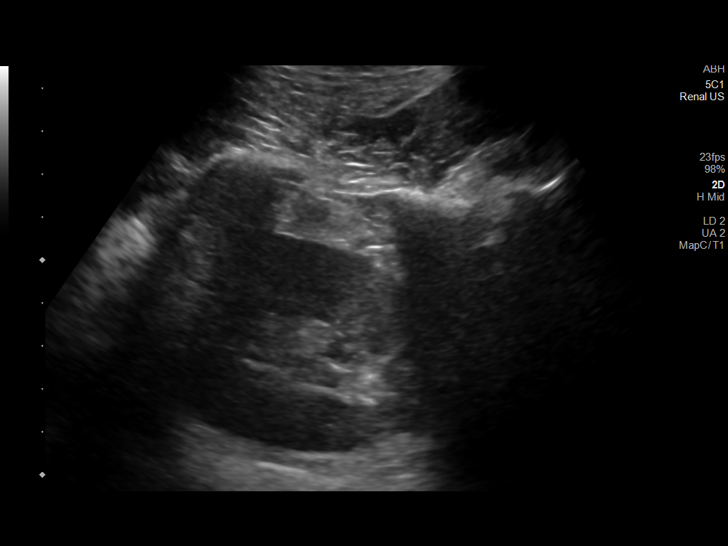
[im 12/17]
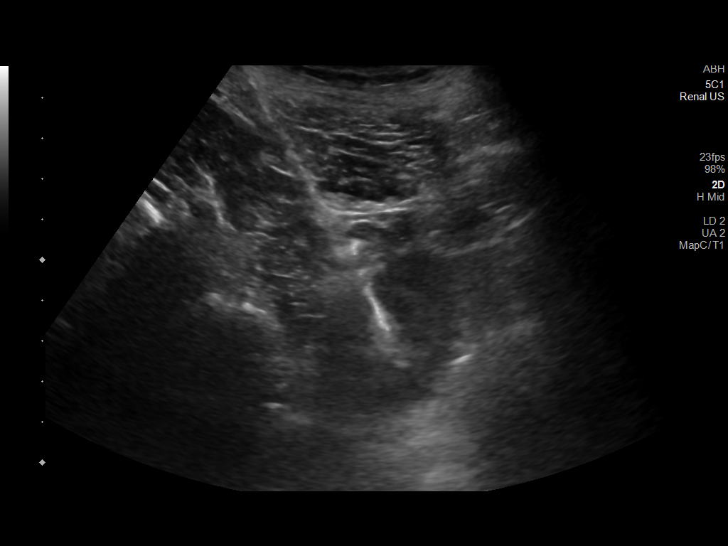
[im 13/17]
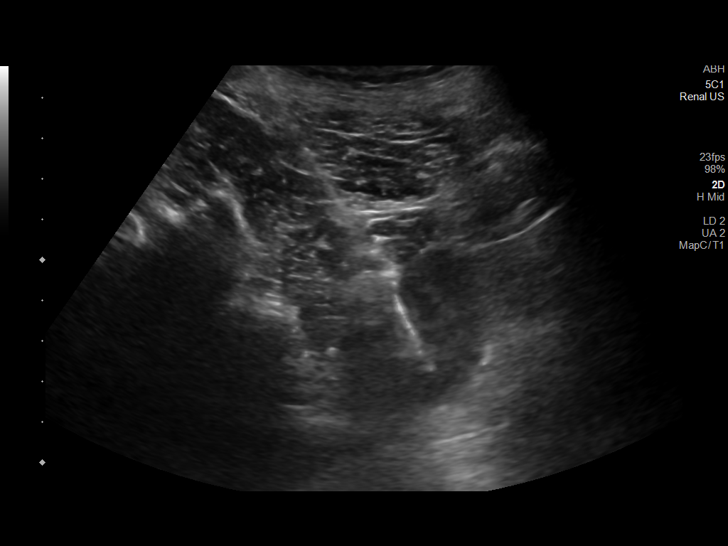
[im 14/17]
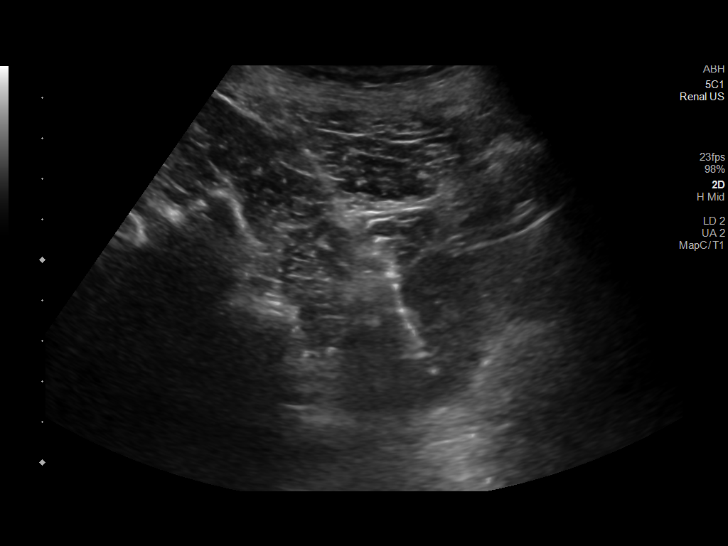
[im 16/17]
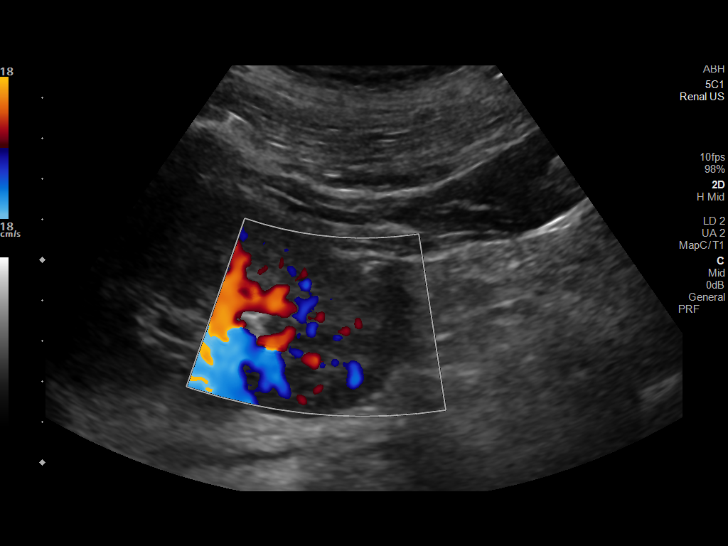
[im 17/17]
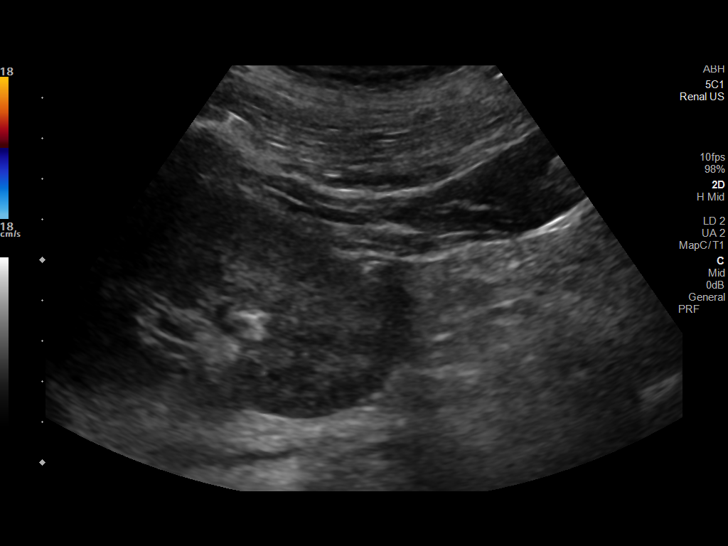

[13 of 17 positions shown; findings below may reference images not displayed]

EXAM:
ULTRASOUND-GUIDED CORE BIOPSY OF RIGHT KIDNEY

MEDICATIONS:
None.

ANESTHESIA/SEDATION:
Moderate (conscious) sedation was employed during this procedure. A
total of Versed 3.0mg and fentanyl 75 mcg was administered
intravenously at the order of the provider performing the procedure.

Total intra-service moderate sedation time: 18 minutes.

Patient's level of consciousness and vital signs were monitored
continuously by radiology nurse throughout the procedure under the
supervision of the provider performing the procedure.

FLUOROSCOPY TIME:  None

COMPLICATIONS:
None immediate.

PROCEDURE:
Informed written consent was obtained from the patient after a
thorough discussion of the procedural risks, benefits and
alternatives. All questions were addressed.A timeout was performed
prior to the initiation of the procedure.

Patient was placed prone. Both kidneys were evaluated with
ultrasound. The right kidney was selected for biopsy. The right
flank was prepped with chlorhexidine and sterile field was created.
Maximal barrier sterile technique was utilized including caps, mask,
sterile gowns, sterile gloves, sterile drape, hand hygiene and skin
antiseptic. Skin was anesthetized using 1% lidocaine. Small incision
was made. Using ultrasound guidance, a coaxial needle was directed
towards the lower pole and placed along the lower pole cortex. 16
gauge core biopsy was obtained from the lower pole. Specimen was
adequate and placed in saline. A second core biopsy was obtained
from the lower pole. Coaxial needle was removed. Follow-up
ultrasound images were obtained. Bandage placed over the puncture
site.
FINDINGS: Negative for hydronephrosis. Trace perinephric fluid noted around
both kidneys. Core biopsies obtained from the right kidney lower
pole. No immediate bleeding or hematoma formation. Two adequate
specimens obtained.
IMPRESSION: Ultrasound-guided core biopsies from the right kidney lower pole.

## 2023-06-20 ENCOUNTER — Other Ambulatory Visit: Payer: Self-pay | Admitting: Family Medicine

## 2023-06-20 DIAGNOSIS — E119 Type 2 diabetes mellitus without complications: Secondary | ICD-10-CM

## 2023-09-07 ENCOUNTER — Emergency Department (HOSPITAL_COMMUNITY): Payer: Medicare Other

## 2023-09-07 ENCOUNTER — Encounter (HOSPITAL_COMMUNITY): Payer: Self-pay

## 2023-09-07 ENCOUNTER — Emergency Department (HOSPITAL_COMMUNITY)
Admission: EM | Admit: 2023-09-07 | Discharge: 2023-09-07 | Disposition: A | Discharge status: Home / Self Care | Payer: Medicare Other | Attending: Emergency Medicine | Admitting: Emergency Medicine

## 2023-09-07 ENCOUNTER — Other Ambulatory Visit: Payer: Self-pay

## 2023-09-07 DIAGNOSIS — H538 Other visual disturbances: Secondary | ICD-10-CM

## 2023-09-07 DIAGNOSIS — Z79899 Other long term (current) drug therapy: Secondary | ICD-10-CM | POA: Diagnosis not present

## 2023-09-07 DIAGNOSIS — I10 Essential (primary) hypertension: Secondary | ICD-10-CM | POA: Diagnosis present

## 2023-09-07 LAB — BASIC METABOLIC PANEL
Anion gap: 9 (ref 5–15)
BUN: 11 mg/dL (ref 8–23)
CO2: 23 mmol/L (ref 22–32)
Calcium: 8.9 mg/dL (ref 8.9–10.3)
Chloride: 110 mmol/L (ref 98–111)
Creatinine, Ser: 0.83 mg/dL (ref 0.61–1.24)
GFR, Estimated: >60 mL/min (ref >60)
Glucose, Bld: 96 mg/dL (ref 70–99)
Potassium: 4.1 mmol/L (ref 3.5–5.1)
Sodium: 142 mmol/L (ref 135–145)

## 2023-09-07 LAB — CBC WITH DIFFERENTIAL/PLATELET
Abs Immature Granulocytes: 0.01 10*3/uL (ref 0.00–0.07)
Basophils Absolute: 0.1 10*3/uL (ref 0.0–0.1)
Basophils Relative: 2 %
Eosinophils Absolute: 0.2 10*3/uL (ref 0.0–0.5)
Eosinophils Relative: 4 %
HCT: 35.2 % — ABNORMAL LOW (ref 39.0–52.0)
Hemoglobin: 11.5 g/dL — ABNORMAL LOW (ref 13.0–17.0)
Immature Granulocytes: 0 %
Lymphocytes Relative: 27 %
Lymphs Abs: 1.2 10*3/uL (ref 0.7–4.0)
MCH: 31.9 pg (ref 26.0–34.0)
MCHC: 32.7 g/dL (ref 30.0–36.0)
MCV: 97.5 fL (ref 80.0–100.0)
Monocytes Absolute: 0.4 10*3/uL (ref 0.1–1.0)
Monocytes Relative: 9 %
Neutro Abs: 2.7 10*3/uL (ref 1.7–7.7)
Neutrophils Relative %: 58 %
Platelets: 381 10*3/uL (ref 150–400)
RBC: 3.61 MIL/uL — ABNORMAL LOW (ref 4.22–5.81)
RDW: 12.7 % (ref 11.5–15.5)
WBC: 4.5 10*3/uL (ref 4.0–10.5)
nRBC: 0 % (ref 0.0–0.2)

## 2023-09-07 NOTE — ED Provider Notes (Addendum)
[place] EMERGENCY DEPARTMENT AT [facility] Provider Note   CSN: [id number] Arrival date & time: 09/07/23  1115     History  Chief Complaint  Patient presents with   Hypertension    Daniel Jones is a 68 y.o. male.  Patient presents with elevated blood pressure over the past week.  Patient has primary doctor and nephrology to follow-up with.  Patient had mild blurry vision both eyes without facial droop, unilateral weakness or numbness, speech changes.  No history of stroke or heart problems.  Systolic blood pressure on the weekend was 160.  Currently patient has no significant signs or symptoms.  No chest pain or shortness of breath recently.  Patient has been taking lisinopril regularly..   Hypertension Pertinent negatives include no chest pain, no abdominal pain, no headaches and no shortness of breath.       Home Medications Prior to Admission medications   Medication Sig Start Date End Date Taking? Authorizing Provider  Ascorbic Acid (VITAMIN C PO) Take 1 tablet by mouth daily. With vitamin d    [provider]  clobetasol cream (TEMOVATE) 0.05 % APPLY TO AFFECTED AREA TWICE A DAY Patient taking differently: Apply 1 application topically 2 (two) times daily as needed (irritation). 08/21/20   Nelwyn Salisbury, MD  furosemide (LASIX) 40 MG tablet Take 2 tablets (80 mg total) by mouth daily. TAKE 1 TABLET BY MOUTH EVERY DAY 04/10/22   Nelwyn Salisbury, MD  lisinopril (ZESTRIL) 10 MG tablet Take 10 mg by mouth daily.    [provider]  metFORMIN (GLUCOPHAGE) 1000 MG tablet TAKE 1 TABLET (1,000 MG TOTAL) BY MOUTH TWICE A DAY WITH FOOD 06/22/23   Nelwyn Salisbury, MD  pravastatin (PRAVACHOL) 20 MG tablet TAKE 1 TABLET BY MOUTH EVERY DAY 04/09/22   Nelwyn Salisbury, MD  SUPER B COMPLEX/C PO Take 1 tablet by mouth daily.    [provider]  Vitamin D, Cholecalciferol, 10 MCG (400 UNIT) CAPS Take 400 Units by mouth daily.    [provider]       Allergies    Patient has no known allergies.    Review of Systems   Review of Systems  Constitutional:  Negative for chills and fever.  HENT:  Negative for congestion.   Eyes:  Negative for visual disturbance.  Respiratory:  Negative for shortness of breath.   Cardiovascular:  Negative for chest pain.  Gastrointestinal:  Negative for abdominal pain and vomiting.  Genitourinary:  Negative for dysuria and flank pain.  Musculoskeletal:  Negative for back pain, neck pain and neck stiffness.  Skin:  Negative for rash.  Neurological:  Negative for light-headedness and headaches.    Physical Exam Updated Vital Signs BP (!) 195/84   Pulse (!) 54   Temp 98.2 F (36.8 C)   Resp 18   Ht 5\' 6"  (1.676 m)   Wt 72.1 kg   SpO2 100%   BMI 25.66 kg/m  Physical Exam Vitals and nursing note reviewed.  Constitutional:      General: He is not in acute distress.    Appearance: He is well-developed.  HENT:     Head: Normocephalic and atraumatic.     Mouth/Throat:     Mouth: Mucous membranes are moist.  Eyes:     General:        Right eye: No discharge.        Left eye: No discharge.     Conjunctiva/sclera: Conjunctivae normal.  Neck:     Trachea: No tracheal deviation.  Cardiovascular:     Rate and Rhythm: Regular rhythm. Bradycardia present.  Pulmonary:     Effort: Pulmonary effort is normal.     Breath sounds: Normal breath sounds.  Abdominal:     General: There is no distension.     Palpations: Abdomen is soft.     Tenderness: There is no abdominal tenderness. There is no guarding.  Musculoskeletal:     Cervical back: Normal range of motion and neck supple. No rigidity.  Skin:    General: Skin is warm.     Capillary Refill: Capillary refill takes less than 2 seconds.  Neurological:     General: No focal deficit present.     Mental Status: He is alert.     Cranial Nerves: No cranial nerve deficit.     Sensory: No sensory deficit.     Motor: No weakness.      Coordination: Coordination normal.     Gait: Gait normal.  Psychiatric:        Mood and Affect: Mood normal.     ED Results / Procedures / Treatments   Labs (all labs ordered are listed, but only abnormal results are displayed) Labs Reviewed  CBC WITH DIFFERENTIAL/PLATELET - Abnormal; Notable for the following components:      Result Value   RBC 3.61 (*)    Hemoglobin 11.5 (*)    HCT 35.2 (*)    All other components within normal limits  BASIC METABOLIC PANEL    EKG None  Radiology CT Head Wo Contrast Result Date: 09/07/2023 CLINICAL DATA:  Headache, blurry vision, hypertension EXAM: CT HEAD WITHOUT CONTRAST TECHNIQUE: Contiguous axial images were obtained from the base of the skull through the vertex without intravenous contrast. RADIATION DOSE REDUCTION: This exam was performed according to the departmental dose-optimization program which includes automated exposure control, adjustment of the mA and/or kV according to patient size and/or use of iterative reconstruction technique. COMPARISON:  None Available. FINDINGS: Brain: No evidence of acute infarction, hemorrhage, mass, mass effect, or midline shift. No hydrocephalus or extra-axial fluid collection. Vascular: No hyperdense vessel. Skull: Negative for fracture or focal lesion. Sinuses/Orbits: Mucous retention cyst in the right sphenoid sinus. Minimal mucosal thickening in the frontal sinuses and anterior ethmoid air cells. No acute finding in the orbits. Other: The mastoid air cells are well aerated. IMPRESSION: No acute intracranial process. Electronically Signed   By: Wiliam Ke M.D.   On: 09/07/2023 16:23    Procedures Procedures    Medications Ordered in ED Medications - No data to display  ED Course/ Medical Decision Making/ A&P                                 Medical Decision Making Amount and/or Complexity of Data Reviewed Labs: ordered.   Patient presents with uncontrolled high blood pressure and minimal  symptoms.  Patient has minimal blurry vision without field deficit and had mild headache.  Patient had blood work which was independently reviewed showing normal electrolytes, kidney function unremarkable no signs of renal failure from high blood pressure.  No signs significant anemia.  CT scan head no large stroke and no hemorrhage.  Reviewed independently.  Patient well-appearing, minimal symptoms on examination, normal gait, no signs of stroke.  Patient is having no chest pain or shortness of breath to suggest cardiac etiology.  Discussed compliance with blood pressure meds  and close follow-up with primary doctor reasons to return discussed in great detail patient comfortable plan.        Final Clinical Impression(s) / ED Diagnoses Final diagnoses:  Primary hypertension  Blurred vision, bilateral    Rx / DC Orders ED Discharge Orders     None         Blane Ohara, MD 09/08/23 0000    Blane Ohara, MD 09/08/23 0000

## 2023-09-07 NOTE — ED Triage Notes (Signed)
Pt states c/o [id number]. Pt states took bp at home on Sat and systolic was 160. Pt states bp today was 151 systolic. Pt c/o int blurred vision. Pt denies dizziness or chest pain

## 2023-09-07 NOTE — Discharge Instructions (Addendum)
Follow-up closely with your primary doctor as you may need adjustment in your blood pressure medication. Until you see your doctor you can increase your lisinopril from 10 mg daily to 20 mg discharge daily unless you develop persistent lightheadedness or low blood pressure. If you develop persistent blurred vision after blood pressure has been controlled or stroke symptoms such as loss of vision, weakness or numbness in one-sided body, speech challenges, facial droop or other call the ambulance or return to the ER immediately.

## 2023-09-07 NOTE — ED Provider Triage Note (Signed)
Emergency Medicine Provider Triage Evaluation Note  Daniel Jones , a 68 y.o. male  was evaluated in triage.  Pt complains of elevated BP. Takes lisinopril 10 mg daily, just increased it to 20 mg one week ago. Checked BP at home and systolic was in the 160s two days ago, today was in the 150s. Had been intentionally fasting throughout the week. Sees nephrology  Review of Systems  Positive: Intermittent blurry vision, mild headache a few days ago Negative: Dizziness, chest pain, peripheral edema  Physical Exam  BP (!) 197/89 (BP Location: Right Arm)   Pulse (!) 51   Temp 98.4 F (36.9 C)   Resp 18   Ht 5\' 6"  (1.676 m)   Wt 72.1 kg   SpO2 100%   BMI 25.66 kg/m  Gen:   Awake, no distress   Resp:  Normal effort  MSK:   Moves extremities without difficulty  Other:    Medical Decision Making  Medically screening exam initiated at 12:28 PM.  Appropriate orders placed.  Daniel Jones was informed that the remainder of the evaluation will be completed by another provider, this initial triage assessment does not replace that evaluation, and the importance of remaining in the ED until their evaluation is complete.  Workup initiated including basic labs, CT head     Gladies Sofranko T, PA-C 09/07/23 1232

## 2023-09-08 ENCOUNTER — Ambulatory Visit: Payer: Self-pay | Admitting: Family Medicine

## 2023-09-08 NOTE — Telephone Encounter (Signed)
 Noted

## 2023-09-08 NOTE — Telephone Encounter (Addendum)
  Chief Complaint: HTN Symptoms: HTN Frequency: Off and on Pertinent Negatives: Patient denies headache, vision changes, chest pain Disposition: [] ED /[] Urgent Care (no appt availability in office) / [x] Appointment(In office/virtual)/ []  [facility]/ [] Home Care/ [] Refused Recommended Disposition /[]  Mobile Bus/ []  Follow-up with PCP Additional Notes: Patient called with complaints of HTN post ER visit on 09/06/22. Patient states he went to the ER over the weekend due to vision changes (fuzzy), headache, and high BP (194/84). Patient states he took his BP last night with systolic reading 868 and today at 9am with reading of 148/73. Patient states he was not sure if he should take any medication and has not taken his dose. Patient is asymptomatic. Patient advised by this RN to be seen within 2 weeks per protocol, to which patient was agreeable. Appt scheduled. Patient educated by this RN the importance of taking BP medication, how and when to take it, and  provided waswith BP education related to measuring of BP, symptoms to watch for, lifestyle, and patient's work-life safety concerns. Patient advised by this RN to call back with worsening symptoms. Patient verbalized understanding.   Copied from CRM [id number]. Topic: Clinical - Red Word Triage >> Sep 08, 2023  9:06 AM Robinson H wrote: [facility] that prompted transfer to Nurse Triage: Patient states high blood pressure 148/73 pulse 60, states he went to emergency room yesterday for high blood pressure as well. Reason for Disposition  [1] Systolic BP  >= 130 OR Diastolic >= 80 AND [2] not taking BP medications  Answer Assessment - Initial Assessment Questions 1. BLOOD PRESSURE: What is the blood pressure? Did you take at least two measurements 5 minutes apart?     148/73; not 5 minutes apart, only one reading. 131 systolic last night 2. ONSET: When did you take your blood pressure?    Today 9 am 3. HOW: How did you take  your blood pressure? (e.g., automatic home BP monitor, visiting nurse)     Automatic BP 4. HISTORY: Do you have a history of high blood pressure?     Dx 11/24 5. MEDICINES: Are you taking any medicines for blood pressure? Have you missed any doses recently?     Lisinopril  I didn't take it today. 6. OTHER SYMPTOMS: Do you have any symptoms? (e.g., blurred vision, chest pain, difficulty breathing, headache, weakness)     Denies  Protocols used: Blood Pressure - High-A-AH

## 2023-09-09 ENCOUNTER — Ambulatory Visit (INDEPENDENT_AMBULATORY_CARE_PROVIDER_SITE_OTHER): Payer: Medicare Other | Admitting: Family Medicine

## 2023-09-09 ENCOUNTER — Encounter: Payer: Self-pay | Admitting: Family Medicine

## 2023-09-09 VITALS — BP 134/70 | HR 56 | Temp 98.4°F | Wt 149.4 lb

## 2023-09-09 DIAGNOSIS — R809 Proteinuria, unspecified: Secondary | ICD-10-CM

## 2023-09-09 DIAGNOSIS — N138 Other obstructive and reflux uropathy: Secondary | ICD-10-CM

## 2023-09-09 DIAGNOSIS — R600 Localized edema: Secondary | ICD-10-CM | POA: Diagnosis not present

## 2023-09-09 DIAGNOSIS — N401 Enlarged prostate with lower urinary tract symptoms: Secondary | ICD-10-CM | POA: Diagnosis not present

## 2023-09-09 DIAGNOSIS — E785 Hyperlipidemia, unspecified: Secondary | ICD-10-CM

## 2023-09-09 DIAGNOSIS — E119 Type 2 diabetes mellitus without complications: Secondary | ICD-10-CM

## 2023-09-09 DIAGNOSIS — N042 Nephrotic syndrome with diffuse membranous glomerulonephritis, unspecified: Secondary | ICD-10-CM

## 2023-09-09 DIAGNOSIS — I10 Essential (primary) hypertension: Secondary | ICD-10-CM

## 2023-09-09 LAB — HEPATIC FUNCTION PANEL
ALT: 17 U/L (ref 0–53)
AST: 26 U/L (ref 0–37)
Albumin: 3 g/dL — ABNORMAL LOW (ref 3.5–5.2)
Alkaline Phosphatase: 50 U/L (ref 39–117)
Bilirubin, Direct: 0 mg/dL (ref 0.0–0.3)
Total Bilirubin: 0.2 mg/dL (ref 0.2–1.2)
Total Protein: 5.3 g/dL — ABNORMAL LOW (ref 6.0–8.3)

## 2023-09-09 LAB — LIPID PANEL
Cholesterol: 230 mg/dL — ABNORMAL HIGH (ref 0–200)
HDL: 89.8 mg/dL (ref >39.00)
LDL Cholesterol: 122 mg/dL — ABNORMAL HIGH (ref 0–99)
NonHDL: 140.54
Total CHOL/HDL Ratio: 3
Triglycerides: 94 mg/dL (ref 0.0–149.0)
VLDL: 18.8 mg/dL (ref 0.0–40.0)

## 2023-09-09 LAB — CBC WITH DIFFERENTIAL/PLATELET
Basophils Absolute: 0.1 10*3/uL (ref 0.0–0.1)
Basophils Relative: 1.4 % (ref 0.0–3.0)
Eosinophils Absolute: 0.3 10*3/uL (ref 0.0–0.7)
Eosinophils Relative: 6.5 % — ABNORMAL HIGH (ref 0.0–5.0)
HCT: 34.3 % — ABNORMAL LOW (ref 39.0–52.0)
Hemoglobin: 11.6 g/dL — ABNORMAL LOW (ref 13.0–17.0)
Lymphocytes Relative: 24.9 % (ref 12.0–46.0)
Lymphs Abs: 1 10*3/uL (ref 0.7–4.0)
MCHC: 33.7 g/dL (ref 30.0–36.0)
MCV: 96.8 fL (ref 78.0–100.0)
Monocytes Absolute: 0.4 10*3/uL (ref 0.1–1.0)
Monocytes Relative: 10.9 % (ref 3.0–12.0)
Neutro Abs: 2.2 10*3/uL (ref 1.4–7.7)
Neutrophils Relative %: 56.3 % (ref 43.0–77.0)
Platelets: 355 10*3/uL (ref 150.0–400.0)
RBC: 3.55 Mil/uL — ABNORMAL LOW (ref 4.22–5.81)
RDW: 13.2 % (ref 11.5–15.5)
WBC: 3.9 10*3/uL — ABNORMAL LOW (ref 4.0–10.5)

## 2023-09-09 LAB — BASIC METABOLIC PANEL
BUN: 13 mg/dL (ref 6–23)
CO2: 28 meq/L (ref 19–32)
Calcium: 8.4 mg/dL (ref 8.4–10.5)
Chloride: 109 meq/L (ref 96–112)
Creatinine, Ser: 0.83 mg/dL (ref 0.40–1.50)
GFR: 91.24 mL/min (ref >60.00)
Glucose, Bld: 89 mg/dL (ref 70–99)
Potassium: 4.3 meq/L (ref 3.5–5.1)
Sodium: 142 meq/L (ref 135–145)

## 2023-09-09 LAB — HEMOGLOBIN A1C: Hgb A1c MFr Bld: 6.3 % (ref 4.6–6.5)

## 2023-09-09 LAB — TSH: TSH: 2.9 u[IU]/mL (ref 0.35–5.50)

## 2023-09-09 LAB — PSA: PSA: 1.39 ng/mL (ref 0.10–4.00)

## 2023-09-09 MED ORDER — LISINOPRIL 40 MG PO TABS: 40.0000 mg | ORAL_TABLET | Freq: Every day | ORAL | 3 refills | Status: AC

## 2023-09-09 MED ORDER — METFORMIN HCL 1000 MG PO TABS: 1000.0000 mg | ORAL_TABLET | Freq: Two times a day (BID) | ORAL | 3 refills | Status: AC

## 2023-09-09 NOTE — Progress Notes (Signed)
 Subjective:    Patient ID: Daniel Jones, male    DOB: 25-Jan-1956, 68 y.o.   MRN: [id number]  HPI Here to follow up on an ED visit on 09-07-23 when he presented with blurred vision and a mild headache. His BP on arrival was 195/84. He has been taing Lisinopril  20 mg daily. His exam was normal. His labs were normal including a creatinine of 0.83. A non-contrasted head  CT was normal. He was sent home and told to follow up with us . He saw Dr. Dolan, his nephrologist, on 08-14-23 and his renal status was stable. Today [place] feels  fine.    Review of Systems  Constitutional: Negative.   HENT: Negative.    Eyes: Negative.   Respiratory: Negative.    Cardiovascular: Negative.   Gastrointestinal: Negative.   Genitourinary: Negative.   Musculoskeletal: Negative.   Skin: Negative.   Neurological: Negative.   Psychiatric/Behavioral: Negative.         Objective:   Physical Exam Constitutional:      General: He is not in acute distress.    Appearance: Normal appearance. He is well-developed. He is not diaphoretic.  HENT:     Head: Normocephalic and atraumatic.     Right Ear: External ear normal.     Left Ear: External ear normal.     Nose: Nose normal.     Mouth/Throat:     Pharynx: No oropharyngeal exudate.  Eyes:     General: No scleral icterus.       Right eye: No discharge.        Left eye: No discharge.     Conjunctiva/sclera: Conjunctivae normal.     Pupils: Pupils are equal, round, and reactive to light.  Neck:     Thyroid : No thyromegaly.     Vascular: No JVD.     Trachea: No tracheal deviation.  Cardiovascular:     Rate and Rhythm: Normal rate and regular rhythm.     Pulses: Normal pulses.     Heart sounds: Normal heart sounds. No murmur heard.    No friction rub. No gallop.  Pulmonary:     Effort: Pulmonary effort is normal. No respiratory distress.     Breath sounds: Normal breath sounds. No wheezing or rales.  Chest:     Chest wall: No tenderness.   Abdominal:     General: Bowel sounds are normal. There is no distension.     Palpations: Abdomen is soft. There is no mass.     Tenderness: There is no abdominal tenderness. There is no guarding or rebound.  Genitourinary:    Penis: Normal. No tenderness.      Testes: Normal.     Prostate: Normal.     Rectum: Normal. Guaiac result negative.  Musculoskeletal:        General: No tenderness. Normal range of motion.     Cervical back: Neck supple.  Lymphadenopathy:     Cervical: No cervical adenopathy.  Skin:    General: Skin is warm and dry.     Coloration: Skin is not pale.     Findings: No erythema or rash.  Neurological:     General: No focal deficit present.     Mental Status: He is alert and oriented to person, place, and time.     Cranial Nerves: No cranial nerve deficit.     Motor: No abnormal muscle tone.     Coordination: Coordination normal.     Deep Tendon Reflexes: Reflexes are normal  and symmetric. Reflexes normal.  Psychiatric:        Mood and Affect: Mood normal.        Behavior: Behavior normal.        Thought Content: Thought content normal.        Judgment: Judgment normal.           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 He will follow up with Dr. Dolan. For the HTN, we will increase the Lisinopril  to 40 mg daily. His ankle edema has resolved. For the diabetes and dyslipidemia we will get fasting labs today including an A1c and lipids. He has not had an eye exam for years, so I urged him to set one up soon. Recheck in 3 weeks. We spent a total of ( 35  ) minutes reviewing records and discussing these issues.  Garnette Olmsted, MD

## 2023-09-22 ENCOUNTER — Telehealth: Payer: Self-pay

## 2023-09-22 NOTE — Telephone Encounter (Signed)
 Copied from CRM [id number]. Topic: General - Other >> Sep 22, 2023  1:26 PM Rodman Pickle T wrote: Reason for CRM: patient  bp is still up and down he wants to know if he need's to switch his meds and he needs the results for lab test

## 2023-09-23 NOTE — Telephone Encounter (Signed)
 Please see my Result Note about the labs. I need more information. What kind of BP readings has he been getting?

## 2023-09-29 NOTE — Telephone Encounter (Signed)
 Left detailed message for pt advised to call the office back with more info regarding BP readings

## 2023-09-30 ENCOUNTER — Telehealth: Payer: Self-pay

## 2023-09-30 NOTE — Telephone Encounter (Signed)
 Attempted to call pt back no option to leave a message, will call back in the morning

## 2023-09-30 NOTE — Telephone Encounter (Signed)
 Copied from CRM [id number]. Topic: Clinical - Medical Advice >> Sep 30, 2023  9:04 AM Isabell A wrote: Reason for CRM: Patient returning phone call from [place], states blood pressures readings are up and down, sometimes around 150-140 sometimes its even below 120.

## 2023-10-01 MED ORDER — AMLODIPINE BESYLATE 5 MG PO TABS
5.0000 mg | ORAL_TABLET | Freq: Every day | ORAL | 2 refills | Status: DC
Start: 2023-10-01 — End: 2023-10-05

## 2023-10-01 NOTE — Telephone Encounter (Signed)
 Done

## 2023-10-01 NOTE — Addendum Note (Signed)
 Addended by: Gershon Crane A on: 10/01/2023 08:04 AM   Modules accepted: Orders

## 2023-10-01 NOTE — Telephone Encounter (Signed)
 Pt advised of Dr Clent Ridges message,verbalized understanding, 4 weeks f/u app scheduled and pt advised to bring his BP machine at the visit

## 2023-10-01 NOTE — Telephone Encounter (Signed)
 Stay on the Lisinopril 40 mg daily, and we will add Amlodipine 5 mg daily. I sent in the RX. Have him follow up with [place] in 4 weeks.

## 2023-10-04 ENCOUNTER — Other Ambulatory Visit: Payer: Self-pay | Admitting: Family Medicine

## 2023-10-26 ENCOUNTER — Encounter: Payer: Self-pay | Admitting: Family Medicine

## 2023-10-26 ENCOUNTER — Ambulatory Visit (INDEPENDENT_AMBULATORY_CARE_PROVIDER_SITE_OTHER): Payer: Medicare Other | Admitting: Family Medicine

## 2023-10-26 VITALS — BP 150/76 | HR 49 | Temp 98.1°F | Wt 158.0 lb

## 2023-10-26 DIAGNOSIS — I10 Essential (primary) hypertension: Secondary | ICD-10-CM

## 2023-10-26 MED ORDER — AMLODIPINE BESYLATE 5 MG PO TABS: 10.0000 mg | ORAL_TABLET | Freq: Every day | ORAL | Status: DC

## 2023-10-26 NOTE — Progress Notes (Signed)
   Subjective:    Patient ID: Rony Ratz, male    DOB: 24-Sep-1955, 68 y.o.   MRN: [id number]  HPI Here to follow up on HTN. For the past few weeks he has been taking 5 mg of Amlodipine and 40 mg of Lisinopril every day. He takes both of these in the mornings. At home his morning pressures have been running in the 160's systolic, but then they settle down during the day to the 130's. He has felt fine.    Review of Systems  Constitutional: Negative.   Respiratory: Negative.    Cardiovascular: Negative.   Neurological: Negative.        Objective:   Physical Exam Constitutional:      Appearance: Normal appearance.  Cardiovascular:     Rate and Rhythm: Normal rate and regular rhythm.     Pulses: Normal pulses.     Heart sounds: Normal heart sounds.  Pulmonary:     Effort: Pulmonary effort is normal.     Breath sounds: Normal breath sounds.  Musculoskeletal:     Right lower leg: No edema.     Left lower leg: No edema.  Neurological:     Mental Status: He is alert.           Assessment & Plan:  HTN. We will increase the Amlodipine to 10 mg daily. He will take this in the evenings and the Lisinopril in the mornings. This will give him better 24 hour coverage. He will report back in 2 weeks.  Gershon Crane, MD

## 2023-12-10 ENCOUNTER — Encounter (INDEPENDENT_AMBULATORY_CARE_PROVIDER_SITE_OTHER): Payer: Medicare Other | Admitting: Family Medicine

## 2023-12-10 NOTE — Progress Notes (Signed)
 Pt working and could not do visit per check in staff. Pt said would call back to reschedule.

## 2024-03-27 ENCOUNTER — Other Ambulatory Visit: Payer: Self-pay | Admitting: Family Medicine
